# Patient Record
Sex: Female | Born: 1980 | Race: Black or African American | Hispanic: No | Marital: Married | Smoking: Current some day smoker
Health system: Southern US, Community
[De-identification: ages and names within clinical notes are randomized; demographics above are authoritative.]

## PROBLEM LIST (undated history)

## (undated) DIAGNOSIS — K219 Gastro-esophageal reflux disease without esophagitis: Secondary | ICD-10-CM

## (undated) DIAGNOSIS — D649 Anemia, unspecified: Secondary | ICD-10-CM

## (undated) DIAGNOSIS — F419 Anxiety disorder, unspecified: Secondary | ICD-10-CM

## (undated) DIAGNOSIS — F32A Depression, unspecified: Secondary | ICD-10-CM

## (undated) HISTORY — DX: Anxiety disorder, unspecified: F41.9

## (undated) HISTORY — DX: Depression, unspecified: F32.A

## (undated) HISTORY — DX: Gastro-esophageal reflux disease without esophagitis: K21.9

## (undated) HISTORY — PX: WISDOM TOOTH EXTRACTION: SHX21

## (undated) HISTORY — DX: Anemia, unspecified: D64.9

---
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
## 2003-07-23 ENCOUNTER — Inpatient Hospital Stay (HOSPITAL_COMMUNITY): Admission: AD | Admit: 2003-07-23 | Discharge: 2003-07-23 | Payer: Self-pay | Admitting: *Deleted

## 2003-09-08 ENCOUNTER — Emergency Department (HOSPITAL_COMMUNITY): Admission: EM | Admit: 2003-09-08 | Discharge: 2003-09-08 | Payer: Self-pay | Admitting: Emergency Medicine

## 2011-09-21 ENCOUNTER — Other Ambulatory Visit (HOSPITAL_COMMUNITY)
Admission: RE | Admit: 2011-09-21 | Discharge: 2011-09-21 | Disposition: A | Discharge status: Home / Self Care | Payer: Self-pay | Source: Ambulatory Visit | Attending: Obstetrics and Gynecology | Admitting: Obstetrics and Gynecology

## 2011-09-21 DIAGNOSIS — Z01419 Encounter for gynecological examination (general) (routine) without abnormal findings: Secondary | ICD-10-CM | POA: Insufficient documentation

## 2013-05-03 ENCOUNTER — Other Ambulatory Visit: Payer: Self-pay | Admitting: Obstetrics and Gynecology

## 2013-05-03 ENCOUNTER — Other Ambulatory Visit (HOSPITAL_COMMUNITY)
Admission: RE | Admit: 2013-05-03 | Discharge: 2013-05-03 | Disposition: A | Discharge status: Home / Self Care | Payer: BC Managed Care – PPO | Source: Ambulatory Visit | Attending: Obstetrics and Gynecology | Admitting: Obstetrics and Gynecology

## 2013-05-03 DIAGNOSIS — Z01419 Encounter for gynecological examination (general) (routine) without abnormal findings: Secondary | ICD-10-CM | POA: Insufficient documentation

## 2013-05-03 DIAGNOSIS — Z1151 Encounter for screening for human papillomavirus (HPV): Secondary | ICD-10-CM | POA: Insufficient documentation

## 2015-04-28 ENCOUNTER — Other Ambulatory Visit (HOSPITAL_COMMUNITY)
Admission: RE | Admit: 2015-04-28 | Discharge: 2015-04-28 | Disposition: A | Discharge status: Home / Self Care | Payer: BLUE CROSS/BLUE SHIELD | Source: Ambulatory Visit | Attending: Obstetrics and Gynecology | Admitting: Obstetrics and Gynecology

## 2015-04-28 ENCOUNTER — Other Ambulatory Visit: Payer: Self-pay | Admitting: Obstetrics and Gynecology

## 2015-04-28 DIAGNOSIS — Z01419 Encounter for gynecological examination (general) (routine) without abnormal findings: Secondary | ICD-10-CM | POA: Diagnosis not present

## 2015-05-02 LAB — CYTOLOGY - PAP

## 2016-09-16 ENCOUNTER — Institutional Professional Consult (permissible substitution): Payer: Self-pay | Admitting: Family Medicine

## 2018-02-05 ENCOUNTER — Other Ambulatory Visit: Payer: Self-pay

## 2018-02-05 ENCOUNTER — Ambulatory Visit (HOSPITAL_COMMUNITY)
Admission: EM | Admit: 2018-02-05 | Discharge: 2018-02-05 | Disposition: A | Discharge status: Home / Self Care | Payer: BLUE CROSS/BLUE SHIELD | Attending: Internal Medicine | Admitting: Internal Medicine

## 2018-02-05 ENCOUNTER — Encounter (HOSPITAL_COMMUNITY): Payer: Self-pay | Admitting: Emergency Medicine

## 2018-02-05 DIAGNOSIS — K047 Periapical abscess without sinus: Secondary | ICD-10-CM | POA: Insufficient documentation

## 2018-02-05 MED ORDER — CEFTRIAXONE SODIUM 250 MG IJ SOLR
250.0000 mg | Freq: Once | INTRAMUSCULAR | Status: AC
  Administered 2018-02-05: 250 mg via INTRAMUSCULAR

## 2018-02-05 MED ORDER — LIDOCAINE HCL (PF) 1 % IJ SOLN
INTRAMUSCULAR | Status: AC
  Filled 2018-02-05: qty 2

## 2018-02-05 MED ORDER — CEFTRIAXONE SODIUM 250 MG IJ SOLR
INTRAMUSCULAR | Status: AC
  Filled 2018-02-05: qty 250

## 2018-02-05 MED ORDER — AMOXICILLIN-POT CLAVULANATE 875-125 MG PO TABS: 1.0000 | ORAL_TABLET | Freq: Two times a day (BID) | ORAL | 0 refills | Status: DC

## 2018-02-05 NOTE — ED Triage Notes (Signed)
Oral abscess symptoms noticed a week ago. Initially painful, swollen.  Over the past 2 days symptoms worsened significantly

## 2018-02-05 NOTE — ED Provider Notes (Signed)
[facility]    CSN: [id number] Arrival date & time: 02/05/18  1622     History   Chief Complaint Chief Complaint  Patient presents with  . Dental Pain    HPI Kelli Russo is a 38 y.o. female.   38 year old female with pain in her left upper jaw x1 week.  She denies fevers, nausea, vomiting or diarrhea.     History reviewed. No pertinent past medical history.  There are no active problems to display for this patient.   Past Surgical History:  Procedure Laterality Date  . WISDOM TOOTH EXTRACTION      OB History   No obstetric history on file.      Home Medications    Prior to Admission medications   Medication Sig Start Date End Date Taking? Authorizing Provider  ibuprofen (ADVIL,MOTRIN) 200 MG tablet Take 200 mg by mouth every 6 (six) hours as needed.   Yes [provider]  amoxicillin-clavulanate (AUGMENTIN) 875-125 MG tablet Take 1 tablet by mouth every 12 (twelve) hours. 02/05/18   Arnaldo Natal, MD    Family History Family History  Problem Relation Age of Onset  . Diabetes Mother   . CAD Mother   . Cancer Father   . Diabetes Father     Social History Social History   Tobacco Use  . Smoking status: Current Some Day Smoker  Substance Use Topics  . Alcohol use: Yes  . Drug use: Never     Allergies   Patient has no known allergies.   Review of Systems Review of Systems  Constitutional: Negative for chills and fever.  HENT: Negative for sore throat and tinnitus.   Eyes: Negative for redness.  Respiratory: Negative for cough and shortness of breath.   Cardiovascular: Negative for chest pain and palpitations.  Gastrointestinal: Negative for abdominal pain, diarrhea, nausea and vomiting.  Genitourinary: Negative for dysuria, frequency and urgency.  Musculoskeletal: Negative for myalgias.  Skin: Negative for rash.       No lesions  Neurological: Negative for weakness.  Hematological: Does not bruise/bleed easily.    Psychiatric/Behavioral: Negative for suicidal ideas.     Physical Exam Triage Vital Signs ED Triage Vitals  Enc Vitals Group     BP 02/05/18 1752 118/69     Pulse Rate 02/05/18 1752 75     Resp 02/05/18 1752 16     Temp 02/05/18 1752 99.6 F (37.6 C)     Temp Source 02/05/18 1752 Temporal     SpO2 02/05/18 1752 100 %     Weight --      Height --      Head Circumference --      Peak Flow --      Pain Score 02/05/18 1748 3     Pain Loc --      Pain Edu? --      Excl. in GC? --    No data found.  Updated Vital Signs BP 118/69 (BP Location: Right Arm)   Pulse 75   Temp 99.6 F (37.6 C) (Temporal)   Resp 16   LMP 02/05/2018   SpO2 100%   Visual Acuity Right Eye Distance:   Left Eye Distance:   Bilateral Distance:    Right Eye Near:   Left Eye Near:    Bilateral Near:     Physical Exam Vitals signs and nursing note reviewed.  Constitutional:      General: She is not in acute distress.  Appearance: She is well-developed.  HENT:     Head: Normocephalic and atraumatic.     Mouth/Throat:   Eyes:     General: No scleral icterus.    Conjunctiva/sclera: Conjunctivae normal.     Pupils: Pupils are equal, round, and reactive to light.  Neck:     Musculoskeletal: Normal range of motion and neck supple.     Thyroid: No thyromegaly.     Vascular: No JVD.     Trachea: No tracheal deviation.  Cardiovascular:     Rate and Rhythm: Normal rate and regular rhythm.     Heart sounds: Normal heart sounds. No murmur. No friction rub. No gallop.   Pulmonary:     Effort: Pulmonary effort is normal.     Breath sounds: Normal breath sounds.  Abdominal:     General: Bowel sounds are normal. There is no distension.     Palpations: Abdomen is soft.     Tenderness: There is no abdominal tenderness.  Musculoskeletal: Normal range of motion.  Lymphadenopathy:     Cervical: No cervical adenopathy.  Skin:    General: Skin is warm and dry.  Neurological:     Mental Status:  She is alert and oriented to person, place, and time.     Cranial Nerves: No cranial nerve deficit.  Psychiatric:        Behavior: Behavior normal.        Thought Content: Thought content normal.        Judgment: Judgment normal.      UC Treatments / Results  Labs (all labs ordered are listed, but only abnormal results are displayed) Labs Reviewed - No data to display  EKG None  Radiology No results found.  Procedures Procedures (including critical care time)  Medications Ordered in UC Medications  cefTRIAXone (ROCEPHIN) injection 250 mg (has no administration in time range)    Initial Impression / Assessment and Plan / UC Course  I have reviewed the triage vital signs and the nursing notes.  Pertinent labs & imaging results that were available during my care of the patient were reviewed by me and considered in my medical decision making (see chart for details).     Augmentin for dental abscess.  No signs or symptoms of sepsis.  Advised patient to seek dental evaluation ASAP. Final Clinical Impressions(s) / UC Diagnoses   Final diagnoses:  Dental abscess   Discharge Instructions   None    ED Prescriptions    Medication Sig Dispense Auth. Provider   amoxicillin-clavulanate (AUGMENTIN) 875-125 MG tablet Take 1 tablet by mouth every 12 (twelve) hours. 14 tablet Arnaldo Natal, MD     Controlled Substance Prescriptions [place] Controlled Substance Registry consulted? Not Applicable   Arnaldo Natal, MD 02/05/18 Claria Dice

## 2019-04-24 ENCOUNTER — Other Ambulatory Visit: Payer: Self-pay

## 2019-04-24 ENCOUNTER — Ambulatory Visit (HOSPITAL_COMMUNITY)
Admission: EM | Admit: 2019-04-24 | Discharge: 2019-04-24 | Disposition: A | Discharge status: Home / Self Care | Payer: BC Managed Care – PPO | Attending: Urgent Care | Admitting: Urgent Care

## 2019-04-24 ENCOUNTER — Encounter (HOSPITAL_COMMUNITY): Payer: Self-pay

## 2019-04-24 DIAGNOSIS — R1084 Generalized abdominal pain: Secondary | ICD-10-CM

## 2019-04-24 DIAGNOSIS — N946 Dysmenorrhea, unspecified: Secondary | ICD-10-CM

## 2019-04-24 DIAGNOSIS — R102 Pelvic and perineal pain: Secondary | ICD-10-CM

## 2019-04-24 DIAGNOSIS — Z3202 Encounter for pregnancy test, result negative: Secondary | ICD-10-CM | POA: Diagnosis not present

## 2019-04-24 LAB — POCT URINALYSIS DIP (DEVICE)
Glucose, UA: NEGATIVE mg/dL
Leukocytes,Ua: NEGATIVE
Nitrite: NEGATIVE
Protein, ur: 30 mg/dL — AB
Specific Gravity, Urine: >=1.03 (ref 1.005–1.030)
Urobilinogen, UA: 0.2 mg/dL (ref 0.0–1.0)
pH: 5.5 (ref 5.0–8.0)

## 2019-04-24 LAB — POC URINE PREG, ED: Preg Test, Ur: NEGATIVE

## 2019-04-24 LAB — POCT PREGNANCY, URINE: Preg Test, Ur: NEGATIVE

## 2019-04-24 MED ORDER — TRANEXAMIC ACID 650 MG PO TABS: 1300.0000 mg | ORAL_TABLET | Freq: Three times a day (TID) | ORAL | 0 refills | Status: DC

## 2019-04-24 NOTE — ED Provider Notes (Signed)
[facility]   MRN: [id number] DOB: [birth year]  Subjective:   Kelli Russo is a 39 y.o. female presenting for recurrent and moderate to severe abdominal cramping since yesterday.  Patient started her cycle, states that she usually has heavy cycles and a lot of pelvic cramping.  She used to see a regular doctor through [facility] but is in the process of finding some money.  Used to take tranexamic acid and would like to have this again.  Denies concern for pregnancy, STI or UTI.  No current facility-administered medications for this encounter.  Current Outpatient Medications:  .  amoxicillin-clavulanate (AUGMENTIN) 875-125 MG tablet, Take 1 tablet by mouth every 12 (twelve) hours., Disp: 14 tablet, Rfl: 0 .  ibuprofen (ADVIL,MOTRIN) 200 MG tablet, Take 200 mg by mouth every 6 (six) hours as needed., Disp: , Rfl:    No Known Allergies  History reviewed. No pertinent past medical history.   Past Surgical History:  Procedure Laterality Date  . WISDOM TOOTH EXTRACTION      Family History  Problem Relation Age of Onset  . Diabetes Mother   . CAD Mother   . Cancer Father   . Diabetes Father     Social History   Tobacco Use  . Smoking status: Current Some Day Smoker  Substance Use Topics  . Alcohol use: Yes  . Drug use: Never    ROS   Objective:   Vitals: BP (!) 136/93 (BP Location: Right Arm)   Pulse 69   Temp 98.8 F (37.1 C) (Oral)   Resp 18   LMP 04/23/2019   SpO2 100%   Physical Exam Constitutional:      General: She is not in acute distress.    Appearance: Normal appearance. She is well-developed and normal weight. She is not ill-appearing, toxic-appearing or diaphoretic.  HENT:     Head: Normocephalic and atraumatic.     Right Ear: External ear normal.     Left Ear: External ear normal.     Nose: Nose normal.     Mouth/Throat:     Mouth: Mucous membranes are moist.     Pharynx: Oropharynx is clear.  Eyes:     General: No scleral  icterus.    Extraocular Movements: Extraocular movements intact.     Pupils: Pupils are equal, round, and reactive to light.  Cardiovascular:     Rate and Rhythm: Normal rate and regular rhythm.     Heart sounds: Normal heart sounds. No murmur. No friction rub. No gallop.   Pulmonary:     Effort: Pulmonary effort is normal. No respiratory distress.     Breath sounds: Normal breath sounds. No stridor. No wheezing, rhonchi or rales.  Abdominal:     General: Bowel sounds are normal. There is no distension.     Palpations: Abdomen is soft. There is no mass.     Tenderness: There is generalized abdominal tenderness and tenderness in the periumbilical area and suprapubic area. There is no right CVA tenderness, left CVA tenderness, guarding or rebound.  Skin:    General: Skin is warm and dry.     Coloration: Skin is not pale.     Findings: No rash.  Neurological:     General: No focal deficit present.     Mental Status: She is alert and oriented to person, place, and time.  Psychiatric:        Mood and Affect: Mood normal.        Behavior: Behavior normal.  Thought Content: Thought content normal.        Judgment: Judgment normal.     Results for orders placed or performed during the hospital encounter of 04/24/19 (from the past 24 hour(s))  POCT urinalysis dip (device)     Status: Abnormal   Collection Time: 04/24/19  8:03 PM  Result Value Ref Range   Glucose, UA NEGATIVE NEGATIVE mg/dL   Bilirubin Urine SMALL (A) NEGATIVE   Ketones, ur TRACE (A) NEGATIVE mg/dL   Specific Gravity, Urine >=1.030 1.005 - 1.030   Hgb urine dipstick LARGE (A) NEGATIVE   pH 5.5 5.0 - 8.0   Protein, ur 30 (A) NEGATIVE mg/dL   Urobilinogen, UA 0.2 0.0 - 1.0 mg/dL   Nitrite NEGATIVE NEGATIVE   Leukocytes,Ua NEGATIVE NEGATIVE  POC urine pregnancy     Status: None   Collection Time: 04/24/19  8:10 PM  Result Value Ref Range   Preg Test, Ur NEGATIVE NEGATIVE  Pregnancy, urine POC     Status: None    Collection Time: 04/24/19  8:10 PM  Result Value Ref Range   Preg Test, Ur NEGATIVE NEGATIVE    Assessment and Plan :   1. Generalized abdominal pain   2. Pelvic cramping   3. Dysmenorrhea     Restart [place], provided with information to gynecologic practices. Counseled patient on potential for adverse effects with medications prescribed/recommended today, ER and return-to-clinic precautions discussed, patient verbalized understanding.    Wallis Bamberg, PA-C 04/27/19 1135

## 2019-04-24 NOTE — ED Triage Notes (Signed)
Pt presents with generalized abdominal cramping since yesterday.

## 2019-12-28 ENCOUNTER — Encounter (HOSPITAL_COMMUNITY): Payer: Self-pay | Admitting: Emergency Medicine

## 2019-12-28 ENCOUNTER — Ambulatory Visit (HOSPITAL_COMMUNITY)
Admission: EM | Admit: 2019-12-28 | Discharge: 2019-12-28 | Disposition: A | Discharge status: Home / Self Care | Payer: BC Managed Care – PPO | Attending: Internal Medicine | Admitting: Internal Medicine

## 2019-12-28 ENCOUNTER — Other Ambulatory Visit: Payer: Self-pay

## 2019-12-28 DIAGNOSIS — K047 Periapical abscess without sinus: Secondary | ICD-10-CM

## 2019-12-28 MED ORDER — AMOXICILLIN-POT CLAVULANATE 875-125 MG PO TABS: 1.0000 | ORAL_TABLET | Freq: Two times a day (BID) | ORAL | 0 refills | Status: DC

## 2019-12-28 NOTE — ED Triage Notes (Signed)
Patient c/o LFT sided upper dental abscess x 1 week.   Patient endorses facial swelling on the LFT side that started 2 days ago.   Patient used Listerine with no relief of symptoms.   Patient denies fever.

## 2019-12-28 NOTE — ED Provider Notes (Signed)
[facility]    CSN: [id number] Arrival date & time: 12/28/19  1829      History   Chief Complaint Chief Complaint  Patient presents with  . Dental Problem    HPI Kelli Russo is a 39 y.o. female comes to the urgent care with dental abscess of 1 week duration.  Patient has been using Listerine with partial relief.  She currently has no pain.  Over the past couple of days she  has noticed some left facial swelling and hence the visit to the urgent care.  No fever or chills.  Patient has dental cavity of the left second maxillary premolar.Marland Kitchen   HPI  History reviewed. No pertinent past medical history.  There are no problems to display for this patient.   Past Surgical History:  Procedure Laterality Date  . WISDOM TOOTH EXTRACTION      OB History   No obstetric history on file.      Home Medications    Prior to Admission medications   Medication Sig Start Date End Date Taking? Authorizing Provider  ibuprofen (ADVIL,MOTRIN) 200 MG tablet Take 200 mg by mouth every 6 (six) hours as needed.   Yes [provider]  amoxicillin-clavulanate (AUGMENTIN) 875-125 MG tablet Take 1 tablet by mouth every 12 (twelve) hours. 12/28/19   LampteyBritta Mccreedy, MD    Family History Family History  Problem Relation Age of Onset  . Diabetes Mother   . CAD Mother   . Cancer Father   . Diabetes Father     Social History Social History   Tobacco Use  . Smoking status: Current Some Day Smoker  . Smokeless tobacco: Never Used  Vaping Use  . Vaping Use: Never used  Substance Use Topics  . Alcohol use: Yes  . Drug use: Never     Allergies   Patient has no known allergies.   Review of Systems Review of Systems  HENT: Positive for dental problem and facial swelling.   Eyes: Negative.   Neurological: Negative for dizziness, light-headedness and headaches.     Physical Exam Triage Vital Signs ED Triage Vitals  Enc Vitals Group     BP 12/28/19 1902  116/85     Pulse Rate 12/28/19 1902 (!) 118     Resp 12/28/19 1902 17     Temp 12/28/19 1902 98.5 F (36.9 C)     Temp Source 12/28/19 1902 Oral     SpO2 12/28/19 1902 97 %     Weight --      Height 12/28/19 1900 5\' 3"  (1.6 m)     Head Circumference --      Peak Flow --      Pain Score 12/28/19 1900 0     Pain Loc --      Pain Edu? --      Excl. in GC? --    No data found.  Updated Vital Signs BP 116/85 (BP Location: Right Arm)   Pulse (!) 118   Temp 98.5 F (36.9 C) (Oral)   Resp 17   Ht 5\' 3"  (1.6 m)   LMP 12/23/2019   SpO2 97%   Visual Acuity Right Eye Distance:   Left Eye Distance:   Bilateral Distance:    Right Eye Near:   Left Eye Near:    Bilateral Near:     Physical Exam Vitals and nursing note reviewed.  Constitutional:      General: She is not in acute distress.  Appearance: She is not ill-appearing.  HENT:     Right Ear: Tympanic membrane normal.     Left Ear: Tympanic membrane normal.     Nose:     Comments: Left premolar dental cavity with gum swelling.  Mild left facial swelling.    Mouth/Throat:     Mouth: Mucous membranes are moist.     Pharynx: No posterior oropharyngeal erythema.  Cardiovascular:     Rate and Rhythm: Normal rate and regular rhythm.  Musculoskeletal:     Cervical back: Normal range of motion.  Neurological:     Mental Status: She is alert.      UC Treatments / Results  Labs (all labs ordered are listed, but only abnormal results are displayed) Labs Reviewed - No data to display  EKG   Radiology No results found.  Procedures Procedures (including critical care time)  Medications Ordered in UC Medications - No data to display  Initial Impression / Assessment and Plan / UC Course  I have reviewed the triage vital signs and the nursing notes.  Pertinent labs & imaging results that were available during my care of the patient were reviewed by me and considered in my medical decision making (see chart for  details).     1.  Dental abscess: Augmentin 875-125 mg twice daily for 7 days Over-the-counter ibuprofen as needed for pain Make an appointment with a dentist to have dental work done. Return precautions given. Final Clinical Impressions(s) / UC Diagnoses   Final diagnoses:  Dental abscess     Discharge Instructions     Please call the dental resources given to you at discharge to have some dental work done. If you have pain please take NSAIDs.   ED Prescriptions    Medication Sig Dispense Auth. Provider   amoxicillin-clavulanate (AUGMENTIN) 875-125 MG tablet Take 1 tablet by mouth every 12 (twelve) hours. 14 tablet Jahvon Gosline, Britta Mccreedy, MD     PDMP not reviewed this encounter.   Merrilee Jansky, MD 12/28/19 2114

## 2019-12-28 NOTE — Discharge Instructions (Signed)
Please call the dental resources given to you at discharge to have some dental work done. If you have pain please take NSAIDs.

## 2020-05-18 ENCOUNTER — Other Ambulatory Visit: Payer: Self-pay

## 2020-05-18 ENCOUNTER — Ambulatory Visit (HOSPITAL_COMMUNITY)
Admission: EM | Admit: 2020-05-18 | Discharge: 2020-05-18 | Disposition: A | Discharge status: Home / Self Care | Payer: Self-pay | Attending: Emergency Medicine | Admitting: Emergency Medicine

## 2020-05-18 ENCOUNTER — Emergency Department (HOSPITAL_COMMUNITY)
Admission: EM | Admit: 2020-05-18 | Discharge: 2020-05-18 | Disposition: A | Discharge status: Home / Self Care | Payer: Self-pay | Attending: Emergency Medicine | Admitting: Emergency Medicine

## 2020-05-18 ENCOUNTER — Encounter (HOSPITAL_COMMUNITY): Payer: Self-pay

## 2020-05-18 ENCOUNTER — Encounter (HOSPITAL_COMMUNITY): Payer: Self-pay | Admitting: Emergency Medicine

## 2020-05-18 DIAGNOSIS — R109 Unspecified abdominal pain: Secondary | ICD-10-CM | POA: Insufficient documentation

## 2020-05-18 DIAGNOSIS — Z5321 Procedure and treatment not carried out due to patient leaving prior to being seen by health care provider: Secondary | ICD-10-CM | POA: Insufficient documentation

## 2020-05-18 DIAGNOSIS — R634 Abnormal weight loss: Secondary | ICD-10-CM | POA: Insufficient documentation

## 2020-05-18 DIAGNOSIS — R1010 Upper abdominal pain, unspecified: Secondary | ICD-10-CM

## 2020-05-18 DIAGNOSIS — R5383 Other fatigue: Secondary | ICD-10-CM | POA: Insufficient documentation

## 2020-05-18 MED ORDER — OMEPRAZOLE 20 MG PO CPDR: 20.0000 mg | DELAYED_RELEASE_CAPSULE | Freq: Every day | ORAL | 0 refills | Status: DC

## 2020-05-18 NOTE — ED Notes (Signed)
Per screener, patient turned in labels and reports she is leaving. 

## 2020-05-18 NOTE — ED Triage Notes (Signed)
Sent from UC d/t abdominal pain x4 months, weight loss and fatigue. She reports she was told that she may have pancreatitis.

## 2020-05-18 NOTE — Discharge Instructions (Addendum)
I would recommend going directly to the Emergency Department for further evaluation of your abdominal pain, vomiting, and weight loss.

## 2020-05-18 NOTE — ED Provider Notes (Signed)
[facility]    CSN: [id number] Arrival date & time: 05/18/20  1740      History   Chief Complaint Chief Complaint  Patient presents with  . Gastroesophageal Reflux    HPI Kelli Russo is a 40 y.o. female.   Patient here for evaluation of acid reflux, abdominal pain, and vomiting that has been ongoing for the past 4 months.  Reports taking Prevacid which was initially helpful but is no longer helping.  Reports having significant weight loss.  States pain worse after eating.  Other than taking Prevacid patient has not tried any OTC medications. Denies any trauma, injury, or other precipitating event. Denies any fevers, chest pain, shortness of breath, numbness, tingling, weakness, abdominal pain, or headaches.   ROS: As per HPI, all other pertinent ROS negative   The history is provided by the patient.  Gastroesophageal Reflux    History reviewed. No pertinent past medical history.  There are no problems to display for this patient.   Past Surgical History:  Procedure Laterality Date  . WISDOM TOOTH EXTRACTION      OB History   No obstetric history on file.      Home Medications    Prior to Admission medications   Medication Sig Start Date End Date Taking? Authorizing Provider  omeprazole (PRILOSEC) 20 MG capsule Take 1 capsule (20 mg total) by mouth daily. 05/18/20  Yes Ivette Loyal, NP  amoxicillin-clavulanate (AUGMENTIN) 875-125 MG tablet Take 1 tablet by mouth every 12 (twelve) hours. 12/28/19   LampteyBritta Mccreedy, MD  ibuprofen (ADVIL,MOTRIN) 200 MG tablet Take 200 mg by mouth every 6 (six) hours as needed.    [provider]    Family History Family History  Problem Relation Age of Onset  . Diabetes Mother   . CAD Mother   . Cancer Father   . Diabetes Father     Social History Social History   Tobacco Use  . Smoking status: Current Some Day Smoker  . Smokeless tobacco: Never Used  Vaping Use  . Vaping Use: Never used   Substance Use Topics  . Alcohol use: Yes  . Drug use: Never     Allergies   Patient has no known allergies.   Review of Systems Review of Systems  Constitutional: Positive for unexpected weight change.  Gastrointestinal: Positive for abdominal distention and vomiting.  All other systems reviewed and are negative.    Physical Exam Triage Vital Signs ED Triage Vitals  Enc Vitals Group     BP 05/18/20 1800 128/86     Pulse Rate 05/18/20 1800 84     Resp 05/18/20 1800 18     Temp 05/18/20 1800 98.5 F (36.9 C)     Temp Source 05/18/20 1800 Oral     SpO2 05/18/20 1800 100 %     Weight --      Height --      Head Circumference --      Peak Flow --      Pain Score 05/18/20 1759 5     Pain Loc --      Pain Edu? --      Excl. in GC? --    No data found.  Updated Vital Signs BP 128/86 (BP Location: Right Arm)   Pulse 84   Temp 98.5 F (36.9 C) (Oral)   Resp 18   SpO2 100%   Visual Acuity Right Eye Distance:   Left Eye Distance:   Bilateral Distance:  Right Eye Near:   Left Eye Near:    Bilateral Near:     Physical Exam Vitals and nursing note reviewed.  Constitutional:      General: She is not in acute distress.    Appearance: Normal appearance. She is not ill-appearing, toxic-appearing or diaphoretic.  HENT:     Head: Normocephalic and atraumatic.  Eyes:     Conjunctiva/sclera: Conjunctivae normal.  Cardiovascular:     Rate and Rhythm: Normal rate.     Pulses: Normal pulses.  Pulmonary:     Effort: Pulmonary effort is normal.  Abdominal:     General: Abdomen is flat.  Musculoskeletal:        General: Normal range of motion.     Cervical back: Normal range of motion.  Skin:    General: Skin is warm and dry.  Neurological:     General: No focal deficit present.     Mental Status: She is alert and oriented to person, place, and time.  Psychiatric:        Mood and Affect: Mood normal.      UC Treatments / Results  Labs (all labs ordered  are listed, but only abnormal results are displayed) Labs Reviewed - No data to display  EKG   Radiology No results found.  Procedures Procedures (including critical care time)  Medications Ordered in UC Medications - No data to display  Initial Impression / Assessment and Plan / UC Course  I have reviewed the triage vital signs and the nursing notes.  Pertinent labs & imaging results that were available during my care of the patient were reviewed by me and considered in my medical decision making (see chart for details).     Based on symptoms and weight loss, patient was sent to the emergency room for further evaluation at a higher level of care.  Based on pain and tenderness in the left upper quadrant and vomiting, patient needs further evaluation to rule out pancreatitis.  Patient requesting a refill of another medication for acid reflux.  Omeprazole was sent but encourage patient to still go to the emergency room for further evaluation.  Patient verbalized understanding and will go at this time.  Final Clinical Impressions(s) / UC Diagnoses   Final diagnoses:  Pain of upper abdomen  Loss of weight     Discharge Instructions     I would recommend going directly to the Emergency Department for further evaluation of your abdominal pain, vomiting, and weight loss.     ED Prescriptions    Medication Sig Dispense Auth. Provider   omeprazole (PRILOSEC) 20 MG capsule Take 1 capsule (20 mg total) by mouth daily. 30 capsule Ivette Loyal, NP     PDMP not reviewed this encounter.   Ivette Loyal, NP 05/18/20 [phone]

## 2020-05-18 NOTE — ED Triage Notes (Signed)
Pt present acid reflux, symptom started four months ago. Pt states she Is having stomach pain and weight loss with fatigue. Pt state scared to eat because of the severity of the abdomen pain.

## 2020-09-09 ENCOUNTER — Encounter: Payer: Self-pay | Admitting: Gastroenterology

## 2020-09-24 ENCOUNTER — Ambulatory Visit: Payer: Self-pay | Admitting: Gastroenterology

## 2020-10-10 ENCOUNTER — Other Ambulatory Visit: Payer: No Typology Code available for payment source

## 2020-10-10 ENCOUNTER — Encounter: Payer: Self-pay | Admitting: Gastroenterology

## 2020-10-10 ENCOUNTER — Ambulatory Visit (INDEPENDENT_AMBULATORY_CARE_PROVIDER_SITE_OTHER): Payer: No Typology Code available for payment source | Admitting: Gastroenterology

## 2020-10-10 VITALS — BP 120/80 | HR 67 | Ht 64.0 in | Wt 153.5 lb

## 2020-10-10 DIAGNOSIS — K219 Gastro-esophageal reflux disease without esophagitis: Secondary | ICD-10-CM | POA: Diagnosis not present

## 2020-10-10 DIAGNOSIS — R1084 Generalized abdominal pain: Secondary | ICD-10-CM

## 2020-10-10 DIAGNOSIS — Z800 Family history of malignant neoplasm of digestive organs: Secondary | ICD-10-CM | POA: Diagnosis not present

## 2020-10-10 MED ORDER — DICYCLOMINE HCL 20 MG PO TABS: 20.0000 mg | ORAL_TABLET | ORAL | 1 refills | Status: DC

## 2020-10-10 NOTE — Progress Notes (Signed)
HPI : Kelli Russo is a very pleasant 40 year old female with a history of anxiety and depression referred to [place] for persistent GERD symptoms.  Patient states that she started having GERD symptoms following an episode of acute gastroenteritis last November. Her symptoms consist of heartburn, bloating and dyspepsia.  He initially improved after starting Protonix, but then worsened.  They improved again in May after adding famotidine, but she continues to have symptoms on a near daily basis.  She also takes Alka-Seltzer as needed.  She has a left-sided upper abdominal discomfort which sometimes radiate to the middle.  Sometimes has nausea.  No vomiting.  An H. pylori serology test was negative.  She takes NSAIDs only with her menstrual cycle.  She tried taking probiotics in May without improvement. She reports chronic irregular bowel habits and mostly poorly formed stools. Her father was diagnosed with colon cancer in his late 36s.  She has never had a colonoscopy.   Past Medical History:  Diagnosis Date   Anxiety    Depression    GERD (gastroesophageal reflux disease)      Past Surgical History:  Procedure Laterality Date   WISDOM TOOTH EXTRACTION     Family History  Problem Relation Age of Onset   Diabetes Mother    CAD Mother    Cancer Father    Diabetes Father    Colon cancer Father    Social History   Tobacco Use   Smoking status: Former    Types: Cigarettes   Smokeless tobacco: Never  Vaping Use   Vaping Use: Never used  Substance Use Topics   Alcohol use: Not Currently   Drug use: Never   Current Outpatient Medications  Medication Sig Dispense Refill   Acetaminophen (TYLENOL) 325 MG CAPS Take by mouth.     calcium carbonate (TUMS) 500 MG chewable tablet Chew 1 tablet by mouth daily.     Calcium Carbonate Antacid (ALKA-SELTZER ANTACID PO) Take by mouth.     famotidine (PEPCID) 20 MG tablet Take 20 mg by mouth 2 (two) times daily.     ibuprofen (ADVIL,MOTRIN) 200 MG  tablet Take 200 mg by mouth every 6 (six) hours as needed.     omeprazole (PRILOSEC) 20 MG capsule Take 1 capsule (20 mg total) by mouth daily. 30 capsule 0   pantoprazole (PROTONIX) 20 MG tablet Take 20 mg by mouth 2 (two) times daily.     No current facility-administered medications for this visit.   No Known Allergies   Review of Systems: All systems reviewed and negative except where noted in HPI.    No results found.  Physical Exam: BP 120/80   Pulse 67   Ht 5\' 4"  (1.626 m)   Wt 153 lb 8 oz (69.6 kg)   SpO2 99%   BMI 26.35 kg/m  Constitutional: Pleasant,well-developed, African-American female in no acute distress. HEENT: Normocephalic and atraumatic. Conjunctivae are normal. No scleral icterus. Cardiovascular: Normal rate, regular rhythm.  Pulmonary/chest: Effort normal and breath sounds normal. No wheezing, rales or rhonchi. Abdominal: Soft, nondistended, nontender. Bowel sounds active throughout. There are no masses palpable. No hepatomegaly. Extremities: no edema Neurological: Alert and oriented to person place and time. Skin: Skin is warm and dry. No rashes noted. Psychiatric: Normal mood and affect. Behavior is normal.  CBC No results found for: WBC, RBC, HGB, HCT, PLT, MCV, MCH, MCHC, RDW, LYMPHSABS, MONOABS, EOSABS, BASOSABS  CMP  No results found for: NA, K, CL, CO2, GLUCOSE, BUN, CREATININE, CALCIUM, PROT,  ALBUMIN, AST, ALT, ALKPHOS, BILITOT, GFRNONAA, GFRAA   ASSESSMENT AND PLAN: 40 year old female with persistent GERD and abdominal pain despite daily PPI and H2RA, with negative H. pylori serology, occasional NSAID use.  Also with persistent poorly formed stools.  Symptoms started following an acute gastroenteritis.  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  We will check TTG and IgA to screen for celiac disease.  We will plan for upper endoscopy to evaluate for peptic ulcer disease and reassess for H. pylori.  For the patient's irregular bowel habits and poorly  formed stool, I recommend she start taking Metamucil on a daily basis and try Bentyl to help with her abdominal discomfort. Because of the patient's family history of colon cancer (father, late 91s), she is due for initial screening colonoscopy.  GERD/dyspepsia/abdominal pain - Continue current regimen for now (Protonix, famotidine) - EGD - TTG/IgA - Trial of Bentyl  Family history of colon cancer - Colonoscopy  Loose stools -Metamucil  The details, risks (including bleeding, perforation, infection, missed lesions, medication reactions and possible hospitalization or surgery if complications occur), benefits, and alternatives to EGD/colonoscopy with possible biopsy and possible polypectomy were discussed with the patient and she consents to proceed.    Amandeep Hogston E. Tomasa Rand, MD [facility]   No ref. provider found

## 2020-10-10 NOTE — Patient Instructions (Signed)
If you are age 38 or older, your body mass index should be between 23-30. Your Body mass index is 26.35 kg/m. If this is out of the aforementioned range listed, please consider follow up with your Primary Care Provider.  If you are age 89 or younger, your body mass index should be between 19-25. Your Body mass index is 26.35 kg/m. If this is out of the aformentioned range listed, please consider follow up with your Primary Care Provider.   It has been recommended to you by your physician that you have a(n) Endoscopy and Colonoscopy completed. Per your request, we did not schedule the procedure(s) today. Please contact our office at [phone] should you decide to have the procedure completed. You will be scheduled for a pre-visit and procedure at that time.  Your provider has requested that you go to the basement level for lab work before leaving today. Press "B" on the elevator. The lab is located at the first door on the left as you exit the elevator.  We have sent the following medications to your pharmacy for you to pick up at your convenience: Bentyl 20 mg   Please purchase Metamucil over the counter. Take as directed.   The [facility] providers would like to encourage you to use [facility] to communicate with providers for non-urgent requests or questions.  Due to long hold times on the telephone, sending your provider a message by [facility] may be a faster and more efficient way to get a response.  Please allow 48 business hours for a response.  Please remember that this is for non-urgent requests.   Due to recent changes in healthcare laws, you may see the results of your imaging and laboratory studies on MyChart before your provider has had a chance to review them.  We understand that in some cases there may be results that are confusing or concerning to you. Not all laboratory results come back in the same time frame and the provider may be waiting for multiple results in order to interpret  others.  Please give [place] 48 hours in order for your provider to thoroughly review all the results before contacting the office for clarification of your results.    It was a pleasure to see you today!  Thank you for trusting me with your gastrointestinal care!    Scott E. Tomasa Rand, MD

## 2020-10-13 LAB — IGA: Immunoglobulin A: 198 mg/dL (ref 47–310)

## 2020-10-13 LAB — TISSUE TRANSGLUTAMINASE, IGA: (tTG) Ab, IgA: <1 U/mL

## 2020-10-14 ENCOUNTER — Other Ambulatory Visit: Payer: Self-pay

## 2020-10-14 ENCOUNTER — Telehealth: Payer: Self-pay | Admitting: Gastroenterology

## 2020-10-14 DIAGNOSIS — R1084 Generalized abdominal pain: Secondary | ICD-10-CM

## 2020-10-14 DIAGNOSIS — Z800 Family history of malignant neoplasm of digestive organs: Secondary | ICD-10-CM

## 2020-10-14 MED ORDER — NA SULFATE-K SULFATE-MG SULF 17.5-3.13-1.6 GM/177ML PO SOLN: 1.0000 | Freq: Once | ORAL | 0 refills | Status: AC

## 2020-10-14 NOTE — Telephone Encounter (Signed)
Patient called to reschedule her colonoscopy procedure requested you send her prep medication to the pharmacy.

## 2020-10-14 NOTE — Telephone Encounter (Signed)
Prep has been sent to patients pharmacy, Ambulatory referral has been placed and instructions will be sent to patient.

## 2020-10-18 NOTE — Progress Notes (Signed)
Maya,  Can you please call [name] and let her know that her celiac tests were negative? Thanks

## 2020-10-20 ENCOUNTER — Telehealth: Payer: Self-pay | Admitting: Gastroenterology

## 2020-10-20 MED ORDER — PANTOPRAZOLE SODIUM 20 MG PO TBEC: 20.0000 mg | DELAYED_RELEASE_TABLET | Freq: Two times a day (BID) | ORAL | 1 refills | Status: DC

## 2020-10-20 NOTE — Telephone Encounter (Signed)
All of the patients's questions were answered She asked for a refill of pantoprazole that was prescribed by the urgent care.  Rx sent

## 2020-10-20 NOTE — Telephone Encounter (Signed)
Patient called requested to speak with a nurse regarding her colonoscopy refused to say anything else.

## 2020-10-20 NOTE — Progress Notes (Signed)
Called patient and left VM to call me back.

## 2020-10-29 ENCOUNTER — Other Ambulatory Visit: Payer: Self-pay | Admitting: Gastroenterology

## 2020-11-06 ENCOUNTER — Encounter: Payer: No Typology Code available for payment source | Admitting: Gastroenterology

## 2020-11-17 ENCOUNTER — Other Ambulatory Visit: Payer: Self-pay | Admitting: Gastroenterology

## 2020-11-24 ENCOUNTER — Ambulatory Visit (AMBULATORY_SURGERY_CENTER): Payer: No Typology Code available for payment source | Admitting: Gastroenterology

## 2020-11-24 ENCOUNTER — Encounter: Payer: Self-pay | Admitting: Gastroenterology

## 2020-11-24 VITALS — BP 144/97 | HR 86 | Temp 98.9°F | Resp 13 | Ht 64.0 in | Wt 153.0 lb

## 2020-11-24 DIAGNOSIS — K3189 Other diseases of stomach and duodenum: Secondary | ICD-10-CM

## 2020-11-24 DIAGNOSIS — Z800 Family history of malignant neoplasm of digestive organs: Secondary | ICD-10-CM | POA: Diagnosis present

## 2020-11-24 DIAGNOSIS — R1013 Epigastric pain: Secondary | ICD-10-CM | POA: Diagnosis not present

## 2020-11-24 DIAGNOSIS — K2970 Gastritis, unspecified, without bleeding: Secondary | ICD-10-CM

## 2020-11-24 DIAGNOSIS — Z1211 Encounter for screening for malignant neoplasm of colon: Secondary | ICD-10-CM

## 2020-11-24 DIAGNOSIS — R1084 Generalized abdominal pain: Secondary | ICD-10-CM

## 2020-11-24 DIAGNOSIS — K2980 Duodenitis without bleeding: Secondary | ICD-10-CM | POA: Diagnosis not present

## 2020-11-24 MED ORDER — SODIUM CHLORIDE 0.9 % IV SOLN: 500.0000 mL | Freq: Once | INTRAVENOUS | Status: DC

## 2020-11-24 NOTE — Progress Notes (Signed)
Clarification obtained pt. Here for EGD AND COLONOSCOPY,INSURANCE VERIFIED PRIOR TO PROCEDURE.    CHECK-IN-AM  V/S-[place]

## 2020-11-24 NOTE — Op Note (Signed)
[facility] Patient Name: Kelli Russo Procedure Date: 11/24/2020 3:13 PM MRN: [id number] Endoscopist: Lorin Picket E. Tomasa Rand , MD Age: 40 Referring MD:  Date of Birth: February 04, 1980 Gender: Female Account #: [id number] Procedure:                Upper GI endoscopy Indications:              Epigastric abdominal pain Medicines:                Monitored Anesthesia Care Procedure:                Pre-Anesthesia Assessment:                           - Prior to the procedure, a History and Physical                            was performed, and patient medications and                            allergies were reviewed. The patient's tolerance of                            previous anesthesia was also reviewed. The risks                            and benefits of the procedure and the sedation                            options and risks were discussed with the patient.                            All questions were answered, and informed consent                            was obtained. Prior Anticoagulants: The patient has                            taken no previous anticoagulant or antiplatelet                            agents. ASA Grade Assessment: II - A patient with                            mild systemic disease. After reviewing the risks                            and benefits, the patient was deemed in                            satisfactory condition to undergo the procedure.                           After obtaining informed consent, the endoscope was  passed under direct vision. Throughout the                            procedure, the patient's blood pressure, pulse, and                            oxygen saturations were monitored continuously. The                            Endoscope was introduced through the mouth, and                            advanced to the third part of duodenum. The upper                            GI endoscopy was  accomplished without difficulty.                            The patient tolerated the procedure well. Scope In: Scope Out: Findings:                 The examined portions of the nasopharynx,                            oropharynx and larynx were normal.                           The examined esophagus was normal.                           The entire examined stomach was normal. Biopsies                            were taken with a cold forceps for Helicobacter                            pylori testing. Estimated blood loss was minimal.                           Multiple small sessile polypoid lesions with no                            bleeding were found in the duodenal bulb. Biopsies                            were taken with a cold forceps for histology.                            Estimated blood loss was minimal.                           The exam of the duodenum was otherwise normal. Complications:            No immediate complications. Estimated Blood Loss:     Estimated blood loss was minimal. Impression:               -  The examined portions of the nasopharynx,                            oropharynx and larynx were normal.                           - Normal esophagus.                           - Normal stomach. Biopsied.                           - Multiple duodenal polyps. Biopsied. Suspect                            peptic duodenitis. Recommendation:           - Patient has a contact number available for                            emergencies. The signs and symptoms of potential                            delayed complications were discussed with the                            patient. Return to normal activities tomorrow.                            Written discharge instructions were provided to the                            patient.                           - Resume previous diet.                           - Continue present medications.                           - Await  pathology results.                           - Follow up as needed in the GI clinic with Dr.                            Tomasa Rand. Lorin Picket E. Tomasa Rand, MD 11/24/2020 3:50:06 PM This report has been signed electronically.

## 2020-11-24 NOTE — Progress Notes (Signed)
Report given to PACU, vss 

## 2020-11-24 NOTE — Patient Instructions (Signed)
Handouts on polyps, hemorrhoids, and diverticulosis given to patient.  Await pathology results. Repeat colonoscopy in 5 years for surveillance due to family history.   YOU HAD AN ENDOSCOPIC PROCEDURE TODAY AT THE  ENDOSCOPY CENTER:   Refer to the procedure report that was given to you for any specific questions about what was found during the examination.  If the procedure report does not answer your questions, please call your gastroenterologist to clarify.  If you requested that your care partner not be given the details of your procedure findings, then the procedure report has been included in a sealed envelope for you to review at your convenience later.  YOU SHOULD EXPECT: Some feelings of bloating in the abdomen. Passage of more gas than usual.  Walking can help get rid of the air that was put into your GI tract during the procedure and reduce the bloating. If you had a lower endoscopy (such as a colonoscopy or flexible sigmoidoscopy) you may notice spotting of blood in your stool or on the toilet paper. If you underwent a bowel prep for your procedure, you may not have a normal bowel movement for a few days.  Please Note:  You might notice some irritation and congestion in your nose or some drainage.  This is from the oxygen used during your procedure.  There is no need for concern and it should clear up in a day or so.  SYMPTOMS TO REPORT IMMEDIATELY:  Following lower endoscopy (colonoscopy or flexible sigmoidoscopy):  Excessive amounts of blood in the stool  Significant tenderness or worsening of abdominal pains  Swelling of the abdomen that is new, acute  Fever of 100F or higher  Following upper endoscopy (EGD)  Vomiting of blood or coffee ground material  New chest pain or pain under the shoulder blades  Painful or persistently difficult swallowing  New shortness of breath  Fever of 100F or higher  Black, tarry-looking stools  For urgent or emergent issues, a  gastroenterologist can be reached at any hour by calling [phone]. Do not use MyChart messaging for urgent concerns.    DIET:  We do recommend a small meal at first, but then you may proceed to your regular diet.  Drink plenty of fluids but you should avoid alcoholic beverages for 24 hours.  ACTIVITY:  You should plan to take it easy for the rest of today and you should NOT DRIVE or use heavy machinery until tomorrow (because of the sedation medicines used during the test).    FOLLOW UP: Our staff will call the number listed on your records 48-72 hours following your procedure to check on you and address any questions or concerns that you may have regarding the information given to you following your procedure. If we do not reach you, we will leave a message.  We will attempt to reach you two times.  During this call, we will ask if you have developed any symptoms of COVID 19. If you develop any symptoms (ie: fever, flu-like symptoms, shortness of breath, cough etc.) before then, please call [phone].  If you test positive for Covid 19 in the 2 weeks post procedure, please call and report this information to [place].    If any biopsies were taken you will be contacted by phone or by letter within the next 1-3 weeks.  Please call us at [phone] if you have not heard about the biopsies in 3 weeks.    SIGNATURES/CONFIDENTIALITY: You and/or your care partner have  signed paperwork which will be entered into your electronic medical record.  These signatures attest to the fact that that the information above on your After Visit Summary has been reviewed and is understood.  Full responsibility of the confidentiality of this discharge information lies with you and/or your care-partner.

## 2020-11-24 NOTE — Progress Notes (Signed)
1505 Robinul 0.1 mg IV given due large amount of secretions upon assessment.  MD made aware, vss 

## 2020-11-24 NOTE — Progress Notes (Signed)
[facility] History and Physical   Primary Care Physician:  Patient, No Pcp Per (Inactive)   Reason for Procedure:   Colon cancer screening, high risk; Abdominal pain, bloating, heartburn  Plan:    Screening colonoscopy, diagnostic EGD     HPI: Kelli Russo is a 40 y.o. female who I saw in clinic Sept 23rd for symptoms of abdominal pain, bloating and dyspepsia that were persistent despite acid suppression.  H. Pylori serology test was negative.  Her father was diagnosed with colon cancer in his 14s.  She was recommended to undergo an upper and lower endoscopy.  Since her clinic visit, her upper GI symptoms are unchanged. She is taking Protonix and famotidine, as well as Bentyl.  Bentyl has helped her pain some.   Past Medical History:  Diagnosis Date   Anxiety    Depression    GERD (gastroesophageal reflux disease)     Past Surgical History:  Procedure Laterality Date   WISDOM TOOTH EXTRACTION      Prior to Admission medications   Medication Sig Start Date End Date Taking? Authorizing Provider  Acetaminophen (TYLENOL) 325 MG CAPS Take by mouth.    [provider]  calcium carbonate (TUMS) 500 MG chewable tablet Chew 1 tablet by mouth daily.    [provider]  Calcium Carbonate Antacid (ALKA-SELTZER ANTACID PO) Take by mouth.    [provider]  dicyclomine (BENTYL) 20 MG tablet TAKE 1 TABLET(20 MG) BY MOUTH EVERY 4 HOURS 11/17/20   Jenel Lucks, MD  famotidine (PEPCID) 20 MG tablet Take 20 mg by mouth 2 (two) times daily.    [provider]  ibuprofen (ADVIL,MOTRIN) 200 MG tablet Take 200 mg by mouth every 6 (six) hours as needed.    [provider]  omeprazole (PRILOSEC) 20 MG capsule Take 1 capsule (20 mg total) by mouth daily. 05/18/20   Ivette Loyal, NP  pantoprazole (PROTONIX) 20 MG tablet Take 1 tablet (20 mg total) by mouth 2 (two) times daily. 10/20/20   Jenel Lucks, MD    Current Outpatient  Medications  Medication Sig Dispense Refill   Acetaminophen (TYLENOL) 325 MG CAPS Take by mouth.     calcium carbonate (TUMS) 500 MG chewable tablet Chew 1 tablet by mouth daily.     Calcium Carbonate Antacid (ALKA-SELTZER ANTACID PO) Take by mouth.     dicyclomine (BENTYL) 20 MG tablet TAKE 1 TABLET(20 MG) BY MOUTH EVERY 4 HOURS 30 tablet 1   famotidine (PEPCID) 20 MG tablet Take 20 mg by mouth 2 (two) times daily.     ibuprofen (ADVIL,MOTRIN) 200 MG tablet Take 200 mg by mouth every 6 (six) hours as needed.     omeprazole (PRILOSEC) 20 MG capsule Take 1 capsule (20 mg total) by mouth daily. 30 capsule 0   pantoprazole (PROTONIX) 20 MG tablet Take 1 tablet (20 mg total) by mouth 2 (two) times daily. 60 tablet 1   No current facility-administered medications for this visit.    Allergies as of 11/24/2020   (No Known Allergies)    Family History  Problem Relation Age of Onset   Diabetes Mother    CAD Mother    Cancer Father    Diabetes Father    Colon cancer Father     Social History   Socioeconomic History   Marital status: Married    Spouse name: Not on file   Number of children: Not on file   Years of education: Not on  file   Highest education level: Not on file  Occupational History   Not on file  Tobacco Use   Smoking status: Some Days    Types: Cigarettes   Smokeless tobacco: Never  Vaping Use   Vaping Use: Never used  Substance and Sexual Activity   Alcohol use: Not Currently   Drug use: Never   Sexual activity: Not on file  Other Topics Concern   Not on file  Social History Narrative   Not on file   Social Determinants of Health   Financial Resource Strain: Not on file  Food Insecurity: Not on file  Transportation Needs: Not on file  Physical Activity: Not on file  Stress: Not on file  Social Connections: Not on file  Intimate Partner Violence: Not on file    Review of Systems:  All other review of systems negative except as mentioned in the  HPI.  Physical Exam: Vital signs BP 120/76 (Patient Position: Sitting)   Pulse (!) 106   Temp 98.9 F (37.2 C)   Ht 5\' 4"  (1.626 m)   Wt 153 lb (69.4 kg)   SpO2 100%   BMI 26.26 kg/m   General:   Alert,  Well-developed, well-nourished, pleasant and cooperative in NAD Airway:  Mallampati 1 Lungs:  Clear throughout to auscultation.   Heart:  Regular rate and rhythm; no murmurs, clicks, rubs,  or gallops. Abdomen:  Soft, nontender and nondistended. Normal bowel sounds.   Neuro/Psych:  Normal mood and affect. A and O x 3   Kelli Russo E. , MD [facility]

## 2020-11-24 NOTE — Op Note (Signed)
[facility] Patient Name: Kelli Russo Procedure Date: 11/24/2020 3:13 PM MRN: [id number] Endoscopist: Lorin Picket E. Tomasa Rand , MD Age: 40 Referring MD:  Date of Birth: [birth year] Gender: Female Account #: [id number] Procedure:                Colonoscopy Indications:              Screening in patient at increased risk: Colorectal                            cancer in father before age 14 Medicines:                Monitored Anesthesia Care Procedure:                Pre-Anesthesia Assessment:                           - Prior to the procedure, a History and Physical                            was performed, and patient medications and                            allergies were reviewed. The patient's tolerance of                            previous anesthesia was also reviewed. The risks                            and benefits of the procedure and the sedation                            options and risks were discussed with the patient.                            All questions were answered, and informed consent                            was obtained. Prior Anticoagulants: The patient has                            taken no previous anticoagulant or antiplatelet                            agents. ASA Grade Assessment: II - A patient with                            mild systemic disease. After reviewing the risks                            and benefits, the patient was deemed in                            satisfactory condition to undergo the procedure.  After obtaining informed consent, the colonoscope                            was passed under direct vision. Throughout the                            procedure, the patient's blood pressure, pulse, and                            oxygen saturations were monitored continuously. The                            Olympus PCF-H190DL [id number]) Colonoscope was                            introduced through the anus  and advanced to the the                            terminal ileum, with identification of the                            appendiceal orifice and IC valve. The colonoscopy                            was performed without difficulty. The patient                            tolerated the procedure well. The quality of the                            bowel preparation was good. The terminal ileum,                            ileocecal valve, appendiceal orifice, and rectum                            were photographed. Scope In: 3:30:12 PM Scope Out: 3:41:29 PM Scope Withdrawal Time: 0 hours 8 minutes 35 seconds  Total Procedure Duration: 0 hours 11 minutes 17 seconds  Findings:                 Skin tags were found on perianal exam.                           The digital rectal exam was normal.                           The entire examined colon appeared normal.                           The digital rectal exam was normal. Pertinent                            negatives include normal sphincter tone and no  palpable rectal lesions.                           A few small-mouthed diverticula were found in the                            sigmoid colon.                           The exam was otherwise normal throughout the                            examined colon.                           The terminal ileum appeared normal.                           Non-bleeding internal hemorrhoids were found during                            retroflexion. The hemorrhoids were Grade I                            (internal hemorrhoids that do not prolapse).                           No additional abnormalities were found on                            retroflexion. Complications:            No immediate complications. Estimated blood loss:                            None. Estimated Blood Loss:     Estimated blood loss: none. Impression:               - Perianal skin tags found on perianal  exam.                           - The entire examined colon is normal.                           - Diverticulosis in the sigmoid colon.                           - The examined portion of the ileum was normal.                           - Non-bleeding internal hemorrhoids.                           - No specimens collected. Recommendation:           - Patient has a contact number available for  emergencies. The signs and symptoms of potential                            delayed complications were discussed with the                            patient. Return to normal activities tomorrow.                            Written discharge instructions were provided to the                            patient.                           - Resume previous diet.                           - Continue present medications.                           - Repeat colonoscopy in 5 years for screening                            purposes due to family history of colon cancer. Kalep Full E. Tomasa Rand, MD 11/24/2020 3:54:20 PM This report has been signed electronically.

## 2020-11-26 ENCOUNTER — Telehealth: Payer: Self-pay

## 2020-11-26 NOTE — Telephone Encounter (Signed)
Left message

## 2020-12-01 ENCOUNTER — Encounter: Payer: Self-pay | Admitting: Gastroenterology

## 2020-12-17 ENCOUNTER — Other Ambulatory Visit (HOSPITAL_COMMUNITY): Payer: Self-pay | Admitting: Internal Medicine

## 2020-12-17 ENCOUNTER — Other Ambulatory Visit: Payer: Self-pay | Admitting: Internal Medicine

## 2020-12-17 ENCOUNTER — Other Ambulatory Visit: Payer: Self-pay | Admitting: Gastroenterology

## 2020-12-17 ENCOUNTER — Other Ambulatory Visit: Payer: Self-pay | Admitting: Surgery

## 2020-12-17 DIAGNOSIS — R1013 Epigastric pain: Secondary | ICD-10-CM

## 2020-12-18 ENCOUNTER — Ambulatory Visit (HOSPITAL_COMMUNITY)
Admission: RE | Admit: 2020-12-18 | Discharge: 2020-12-18 | Disposition: A | Discharge status: Home / Self Care | Payer: No Typology Code available for payment source | Source: Ambulatory Visit | Attending: Internal Medicine | Admitting: Internal Medicine

## 2020-12-18 ENCOUNTER — Other Ambulatory Visit: Payer: Self-pay

## 2020-12-18 DIAGNOSIS — R1013 Epigastric pain: Secondary | ICD-10-CM | POA: Diagnosis not present

## 2021-01-20 ENCOUNTER — Ambulatory Visit: Payer: Self-pay | Admitting: Surgery

## 2021-01-20 DIAGNOSIS — K8020 Calculus of gallbladder without cholecystitis without obstruction: Secondary | ICD-10-CM

## 2021-01-20 NOTE — Anesthesia Preprocedure Evaluation (Addendum)
Anesthesia Evaluation  Patient identified by MRN, date of birth, ID band Patient awake    Reviewed: Allergy & Precautions, NPO status , Patient's Chart, lab work & pertinent test results  History of Anesthesia Complications Negative for: history of anesthetic complications  Airway Mallampati: II  TM Distance: >3 FB Neck ROM: Full    Dental  (+) Missing,    Pulmonary Current Smoker,    Pulmonary exam normal        Cardiovascular negative cardio ROS Normal cardiovascular exam     Neuro/Psych Anxiety Depression negative neurological ROS     GI/Hepatic Neg liver ROS, GERD  Medicated and Controlled,cholecystitis   Endo/Other  negative endocrine ROS  Renal/GU negative Renal ROS  negative genitourinary   Musculoskeletal negative musculoskeletal ROS (+)   Abdominal   Peds  Hematology negative hematology ROS (+)   Anesthesia Other Findings Day of surgery medications reviewed with patient.  Reproductive/Obstetrics negative OB ROS                            Anesthesia Physical Anesthesia Plan  ASA: 2  Anesthesia Plan: General   Post-op Pain Management: Tylenol PO (pre-op) and Toradol IV (intra-op)   Induction: Intravenous  PONV Risk Score and Plan: 4 or greater and Treatment may vary due to age or medical condition, Ondansetron, Dexamethasone, Midazolam and Scopolamine patch - Pre-op  Airway Management Planned: Oral ETT  Additional Equipment: None  Intra-op Plan:   Post-operative Plan: Extubation in OR  Informed Consent: I have reviewed the patients History and Physical, chart, labs and discussed the procedure including the risks, benefits and alternatives for the proposed anesthesia with the patient or authorized representative who has indicated his/her understanding and acceptance.     Dental advisory given  Plan Discussed with: CRNA  Anesthesia Plan Comments:         Anesthesia Quick Evaluation

## 2021-01-21 ENCOUNTER — Inpatient Hospital Stay (HOSPITAL_COMMUNITY): Payer: No Typology Code available for payment source | Admitting: Certified Registered Nurse Anesthetist

## 2021-01-21 ENCOUNTER — Other Ambulatory Visit: Payer: Self-pay

## 2021-01-21 ENCOUNTER — Inpatient Hospital Stay (HOSPITAL_COMMUNITY): Payer: No Typology Code available for payment source

## 2021-01-21 ENCOUNTER — Encounter (HOSPITAL_COMMUNITY): Admission: RE | Disposition: A | Discharge status: Home / Self Care | Payer: Self-pay | Source: Home / Self Care

## 2021-01-21 ENCOUNTER — Inpatient Hospital Stay (HOSPITAL_COMMUNITY)
Admission: RE | Admit: 2021-01-21 | Discharge: 2021-01-26 | DRG: 415 | Disposition: A | Discharge status: Home / Self Care | Payer: No Typology Code available for payment source | Attending: General Surgery | Admitting: General Surgery

## 2021-01-21 ENCOUNTER — Encounter (HOSPITAL_COMMUNITY): Payer: Self-pay | Admitting: Surgery

## 2021-01-21 DIAGNOSIS — K219 Gastro-esophageal reflux disease without esophagitis: Secondary | ICD-10-CM | POA: Diagnosis present

## 2021-01-21 DIAGNOSIS — K8012 Calculus of gallbladder with acute and chronic cholecystitis without obstruction: Principal | ICD-10-CM | POA: Diagnosis present

## 2021-01-21 DIAGNOSIS — Z01818 Encounter for other preprocedural examination: Secondary | ICD-10-CM

## 2021-01-21 DIAGNOSIS — Z20822 Contact with and (suspected) exposure to covid-19: Secondary | ICD-10-CM | POA: Diagnosis present

## 2021-01-21 DIAGNOSIS — K823 Fistula of gallbladder: Secondary | ICD-10-CM | POA: Diagnosis present

## 2021-01-21 DIAGNOSIS — K828 Other specified diseases of gallbladder: Secondary | ICD-10-CM | POA: Diagnosis present

## 2021-01-21 DIAGNOSIS — R109 Unspecified abdominal pain: Secondary | ICD-10-CM | POA: Diagnosis present

## 2021-01-21 DIAGNOSIS — Z8249 Family history of ischemic heart disease and other diseases of the circulatory system: Secondary | ICD-10-CM

## 2021-01-21 DIAGNOSIS — F17210 Nicotine dependence, cigarettes, uncomplicated: Secondary | ICD-10-CM | POA: Diagnosis present

## 2021-01-21 DIAGNOSIS — Z79899 Other long term (current) drug therapy: Secondary | ICD-10-CM

## 2021-01-21 DIAGNOSIS — Z5331 Laparoscopic surgical procedure converted to open procedure: Secondary | ICD-10-CM | POA: Diagnosis not present

## 2021-01-21 DIAGNOSIS — K8020 Calculus of gallbladder without cholecystitis without obstruction: Secondary | ICD-10-CM

## 2021-01-21 DIAGNOSIS — Z833 Family history of diabetes mellitus: Secondary | ICD-10-CM

## 2021-01-21 DIAGNOSIS — Z800 Family history of malignant neoplasm of digestive organs: Secondary | ICD-10-CM | POA: Diagnosis not present

## 2021-01-21 DIAGNOSIS — K316 Fistula of stomach and duodenum: Secondary | ICD-10-CM | POA: Diagnosis present

## 2021-01-21 DIAGNOSIS — K660 Peritoneal adhesions (postprocedural) (postinfection): Secondary | ICD-10-CM | POA: Diagnosis present

## 2021-01-21 DIAGNOSIS — Z419 Encounter for procedure for purposes other than remedying health state, unspecified: Secondary | ICD-10-CM

## 2021-01-21 HISTORY — PX: CHOLECYSTECTOMY: SHX55

## 2021-01-21 LAB — LIPASE, BLOOD: Lipase: 253 U/L — ABNORMAL HIGH (ref 11–51)

## 2021-01-21 LAB — COMPREHENSIVE METABOLIC PANEL
ALT: 105 U/L — ABNORMAL HIGH (ref 0–44)
AST: 32 U/L (ref 15–41)
Albumin: 3.9 g/dL (ref 3.5–5.0)
Alkaline Phosphatase: 216 U/L — ABNORMAL HIGH (ref 38–126)
Anion gap: 5 (ref 5–15)
BUN: 13 mg/dL (ref 6–20)
CO2: 26 mmol/L (ref 22–32)
Calcium: 9 mg/dL (ref 8.9–10.3)
Chloride: 103 mmol/L (ref 98–111)
Creatinine, Ser: 0.56 mg/dL (ref 0.44–1.00)
GFR, Estimated: >60 mL/min (ref >60)
Glucose, Bld: 97 mg/dL (ref 70–99)
Potassium: 3.1 mmol/L — ABNORMAL LOW (ref 3.5–5.1)
Sodium: 134 mmol/L — ABNORMAL LOW (ref 135–145)
Total Bilirubin: 0.5 mg/dL (ref 0.3–1.2)
Total Protein: 8.7 g/dL — ABNORMAL HIGH (ref 6.5–8.1)

## 2021-01-21 LAB — PREGNANCY, URINE: Preg Test, Ur: NEGATIVE

## 2021-01-21 LAB — SARS CORONAVIRUS 2 BY RT PCR (HOSPITAL ORDER, PERFORMED IN ~~LOC~~ HOSPITAL LAB): SARS Coronavirus 2: NEGATIVE

## 2021-01-21 SURGERY — LAPAROSCOPIC CHOLECYSTECTOMY WITH INTRAOPERATIVE CHOLANGIOGRAM
Anesthesia: General | Site: Abdomen

## 2021-01-21 MED ORDER — DEXAMETHASONE SODIUM PHOSPHATE 10 MG/ML IJ SOLN
INTRAMUSCULAR | Status: DC | PRN
  Administered 2021-01-21: 5 mg via INTRAVENOUS

## 2021-01-21 MED ORDER — FENTANYL CITRATE (PF) 100 MCG/2ML IJ SOLN
INTRAMUSCULAR | Status: DC | PRN
  Administered 2021-01-21 (×4): 50 ug via INTRAVENOUS
  Administered 2021-01-21: 100 ug via INTRAVENOUS
  Administered 2021-01-21 (×2): 50 ug via INTRAVENOUS

## 2021-01-21 MED ORDER — PROMETHAZINE HCL 25 MG/ML IJ SOLN: 6.2500 mg | INTRAMUSCULAR | Status: DC | PRN

## 2021-01-21 MED ORDER — BUPIVACAINE LIPOSOME 1.3 % IJ SUSP
INTRAMUSCULAR | Status: AC
  Filled 2021-01-21: qty 20

## 2021-01-21 MED ORDER — ONDANSETRON HCL 4 MG/2ML IJ SOLN: 4.0000 mg | Freq: Four times a day (QID) | INTRAMUSCULAR | Status: DC | PRN

## 2021-01-21 MED ORDER — CHLORHEXIDINE GLUCONATE 0.12 % MT SOLN
15.0000 mL | Freq: Once | OROMUCOSAL | Status: AC
  Administered 2021-01-21: 15 mL via OROMUCOSAL

## 2021-01-21 MED ORDER — LIP MEDEX EX OINT: TOPICAL_OINTMENT | CUTANEOUS | Status: DC | PRN

## 2021-01-21 MED ORDER — GABAPENTIN 300 MG PO CAPS
300.0000 mg | ORAL_CAPSULE | ORAL | Status: AC
  Administered 2021-01-21: 300 mg via ORAL
  Filled 2021-01-21: qty 1

## 2021-01-21 MED ORDER — BUPIVACAINE-EPINEPHRINE (PF) 0.25% -1:200000 IJ SOLN
INTRAMUSCULAR | Status: AC
  Filled 2021-01-21: qty 30

## 2021-01-21 MED ORDER — DIPHENHYDRAMINE HCL 50 MG/ML IJ SOLN: 25.0000 mg | Freq: Four times a day (QID) | INTRAMUSCULAR | Status: DC | PRN

## 2021-01-21 MED ORDER — ENOXAPARIN SODIUM 40 MG/0.4ML IJ SOSY
40.0000 mg | PREFILLED_SYRINGE | INTRAMUSCULAR | Status: DC
Start: 2021-01-22 — End: 2021-01-26
  Administered 2021-01-22 – 2021-01-26 (×5): 40 mg via SUBCUTANEOUS
  Filled 2021-01-21 (×6): qty 0.4

## 2021-01-21 MED ORDER — PANTOPRAZOLE SODIUM 40 MG IV SOLR
40.0000 mg | Freq: Every day | INTRAVENOUS | Status: DC
  Administered 2021-01-21 – 2021-01-22 (×2): 40 mg via INTRAVENOUS
  Filled 2021-01-21 (×2): qty 40

## 2021-01-21 MED ORDER — HEMOSTATIC AGENTS (NO CHARGE) OPTIME
TOPICAL | Status: DC | PRN
  Administered 2021-01-21: 1 via TOPICAL

## 2021-01-21 MED ORDER — SCOPOLAMINE 1 MG/3DAYS TD PT72
1.0000 | MEDICATED_PATCH | Freq: Once | TRANSDERMAL | Status: AC
  Administered 2021-01-21: 1.5 mg via TRANSDERMAL
  Filled 2021-01-21: qty 1

## 2021-01-21 MED ORDER — ROCURONIUM BROMIDE 10 MG/ML (PF) SYRINGE
PREFILLED_SYRINGE | INTRAVENOUS | Status: DC | PRN
  Administered 2021-01-21 (×2): 20 mg via INTRAVENOUS
  Administered 2021-01-21: 60 mg via INTRAVENOUS

## 2021-01-21 MED ORDER — FENTANYL CITRATE (PF) 100 MCG/2ML IJ SOLN
INTRAMUSCULAR | Status: AC
  Filled 2021-01-21: qty 2

## 2021-01-21 MED ORDER — DEXAMETHASONE SODIUM PHOSPHATE 10 MG/ML IJ SOLN
INTRAMUSCULAR | Status: AC
  Filled 2021-01-21: qty 1

## 2021-01-21 MED ORDER — METHOCARBAMOL 500 MG IVPB - SIMPLE MED
500.0000 mg | Freq: Three times a day (TID) | INTRAVENOUS | Status: DC
  Administered 2021-01-21 – 2021-01-26 (×16): 500 mg via INTRAVENOUS
  Filled 2021-01-21 (×6): qty 500
  Filled 2021-01-21: qty 50
  Filled 2021-01-21 (×5): qty 500
  Filled 2021-01-21: qty 50
  Filled 2021-01-21 (×3): qty 500
  Filled 2021-01-21 (×2): qty 50

## 2021-01-21 MED ORDER — OXYCODONE HCL 5 MG/5ML PO SOLN: 5.0000 mg | Freq: Once | ORAL | Status: DC | PRN

## 2021-01-21 MED ORDER — PROPOFOL 10 MG/ML IV BOLUS
INTRAVENOUS | Status: AC
  Filled 2021-01-21: qty 20

## 2021-01-21 MED ORDER — ROCURONIUM BROMIDE 10 MG/ML (PF) SYRINGE
PREFILLED_SYRINGE | INTRAVENOUS | Status: AC
  Filled 2021-01-21: qty 10

## 2021-01-21 MED ORDER — ORAL CARE MOUTH RINSE: 15.0000 mL | Freq: Once | OROMUCOSAL | Status: AC

## 2021-01-21 MED ORDER — FENTANYL CITRATE PF 50 MCG/ML IJ SOSY
PREFILLED_SYRINGE | INTRAMUSCULAR | Status: AC
  Filled 2021-01-21: qty 2

## 2021-01-21 MED ORDER — METHOCARBAMOL 500 MG IVPB - SIMPLE MED
INTRAVENOUS | Status: AC
  Filled 2021-01-21: qty 50

## 2021-01-21 MED ORDER — ACETAMINOPHEN 500 MG PO TABS
1000.0000 mg | ORAL_TABLET | Freq: Once | ORAL | Status: DC
  Filled 2021-01-21: qty 2

## 2021-01-21 MED ORDER — OXYCODONE HCL 5 MG PO TABS: 5.0000 mg | ORAL_TABLET | Freq: Once | ORAL | Status: DC | PRN

## 2021-01-21 MED ORDER — FENTANYL CITRATE PF 50 MCG/ML IJ SOSY
25.0000 ug | PREFILLED_SYRINGE | INTRAMUSCULAR | Status: DC | PRN
  Administered 2021-01-21 (×3): 50 ug via INTRAVENOUS

## 2021-01-21 MED ORDER — MIDAZOLAM HCL 2 MG/2ML IJ SOLN
INTRAMUSCULAR | Status: AC
  Filled 2021-01-21: qty 2

## 2021-01-21 MED ORDER — ONDANSETRON HCL 4 MG/2ML IJ SOLN
INTRAMUSCULAR | Status: DC | PRN
  Administered 2021-01-21: 4 mg via INTRAVENOUS

## 2021-01-21 MED ORDER — FENTANYL CITRATE PF 50 MCG/ML IJ SOSY
PREFILLED_SYRINGE | INTRAMUSCULAR | Status: AC
  Filled 2021-01-21: qty 1

## 2021-01-21 MED ORDER — SUGAMMADEX SODIUM 200 MG/2ML IV SOLN
INTRAVENOUS | Status: DC | PRN
  Administered 2021-01-21: 200 mg via INTRAVENOUS

## 2021-01-21 MED ORDER — ONDANSETRON HCL 4 MG/2ML IJ SOLN
INTRAMUSCULAR | Status: AC
  Filled 2021-01-21: qty 2

## 2021-01-21 MED ORDER — PHENOL 1.4 % MT LIQD: 1.0000 | OROMUCOSAL | Status: DC | PRN

## 2021-01-21 MED ORDER — MIDAZOLAM HCL 5 MG/5ML IJ SOLN
INTRAMUSCULAR | Status: DC | PRN
Start: 2021-01-21 — End: 2021-01-21
  Administered 2021-01-21: 2 mg via INTRAVENOUS

## 2021-01-21 MED ORDER — KCL IN DEXTROSE-NACL 20-5-0.45 MEQ/L-%-% IV SOLN
INTRAVENOUS | Status: DC
Start: 2021-01-21 — End: 2021-01-26
  Filled 2021-01-21 (×8): qty 1000

## 2021-01-21 MED ORDER — BUPIVACAINE-EPINEPHRINE 0.25% -1:200000 IJ SOLN
INTRAMUSCULAR | Status: DC | PRN
  Administered 2021-01-21: 15 mL

## 2021-01-21 MED ORDER — BUPIVACAINE LIPOSOME 1.3 % IJ SUSP
INTRAMUSCULAR | Status: DC | PRN
  Administered 2021-01-21: 20 mL

## 2021-01-21 MED ORDER — HYDROMORPHONE 1 MG/ML IV SOLN
INTRAVENOUS | Status: DC
  Administered 2021-01-21: 30 mg via INTRAVENOUS
  Administered 2021-01-21: 2.8 mg via INTRAVENOUS
  Administered 2021-01-21: 1.4 mg via INTRAVENOUS
  Administered 2021-01-21 – 2021-01-22 (×2): 1.2 mg via INTRAVENOUS
  Administered 2021-01-22: 2.2 mg via INTRAVENOUS
  Administered 2021-01-22: 3 mg via INTRAVENOUS
  Administered 2021-01-22: 1.2 mg via INTRAVENOUS
  Administered 2021-01-23: 2.6 mg via INTRAVENOUS
  Administered 2021-01-23: 30 mg via INTRAVENOUS
  Administered 2021-01-23: 1.4 mg via INTRAVENOUS
  Administered 2021-01-23: 3.2 mg via INTRAVENOUS
  Administered 2021-01-23: 0 mL via INTRAVENOUS
  Administered 2021-01-23: 1 mL via INTRAVENOUS
  Administered 2021-01-24: 0.6 mg via INTRAVENOUS
  Administered 2021-01-24: 0.4 mg via INTRAVENOUS
  Administered 2021-01-24: 1.2 mL via INTRAVENOUS
  Administered 2021-01-24: 0.6 mg via INTRAVENOUS
  Administered 2021-01-25: 1.4 mg via INTRAVENOUS
  Filled 2021-01-21 (×12): qty 30

## 2021-01-21 MED ORDER — METRONIDAZOLE 500 MG/100ML IV SOLN
500.0000 mg | Freq: Once | INTRAVENOUS | Status: AC
  Administered 2021-01-21: 500 mg via INTRAVENOUS

## 2021-01-21 MED ORDER — 0.9 % SODIUM CHLORIDE (POUR BTL) OPTIME
TOPICAL | Status: DC | PRN
  Administered 2021-01-21: 350 mL

## 2021-01-21 MED ORDER — SODIUM CHLORIDE (PF) 0.9 % IJ SOLN
INTRAMUSCULAR | Status: DC | PRN
  Administered 2021-01-21: 12 mL

## 2021-01-21 MED ORDER — METRONIDAZOLE 500 MG/100ML IV SOLN
INTRAVENOUS | Status: AC
  Filled 2021-01-21: qty 100

## 2021-01-21 MED ORDER — KETOROLAC TROMETHAMINE 30 MG/ML IJ SOLN
INTRAMUSCULAR | Status: DC | PRN
  Administered 2021-01-21: 30 mg via INTRAVENOUS

## 2021-01-21 MED ORDER — NALOXONE HCL 0.4 MG/ML IJ SOLN: 0.4000 mg | INTRAMUSCULAR | Status: DC | PRN

## 2021-01-21 MED ORDER — LIDOCAINE HCL (PF) 2 % IJ SOLN
INTRAMUSCULAR | Status: AC
  Filled 2021-01-21: qty 5

## 2021-01-21 MED ORDER — KETOROLAC TROMETHAMINE 15 MG/ML IJ SOLN
15.0000 mg | Freq: Three times a day (TID) | INTRAMUSCULAR | Status: DC
  Administered 2021-01-21 – 2021-01-22 (×5): 15 mg via INTRAVENOUS
  Filled 2021-01-21 (×6): qty 1

## 2021-01-21 MED ORDER — LIDOCAINE 2% (20 MG/ML) 5 ML SYRINGE
INTRAMUSCULAR | Status: DC | PRN
  Administered 2021-01-21: 80 mg via INTRAVENOUS

## 2021-01-21 MED ORDER — SODIUM CHLORIDE 0.9% FLUSH: 9.0000 mL | INTRAVENOUS | Status: DC | PRN

## 2021-01-21 MED ORDER — CEFAZOLIN SODIUM-DEXTROSE 2-4 GM/100ML-% IV SOLN
2.0000 g | INTRAVENOUS | Status: AC
  Administered 2021-01-21: 2 g via INTRAVENOUS
  Filled 2021-01-21: qty 100

## 2021-01-21 MED ORDER — PROPOFOL 10 MG/ML IV BOLUS
INTRAVENOUS | Status: DC | PRN
  Administered 2021-01-21: 140 mg via INTRAVENOUS
  Administered 2021-01-21: 40 mg via INTRAVENOUS

## 2021-01-21 MED ORDER — LACTATED RINGERS IV SOLN: INTRAVENOUS | Status: DC

## 2021-01-21 MED ORDER — ACETAMINOPHEN 500 MG PO TABS
1000.0000 mg | ORAL_TABLET | ORAL | Status: AC
  Administered 2021-01-21: 1000 mg via ORAL

## 2021-01-21 SURGICAL SUPPLY — 68 items
ADH SKN CLS APL DERMABOND .7 (GAUZE/BANDAGES/DRESSINGS) ×1
APL PRP STRL LF DISP 70% ISPRP (MISCELLANEOUS) ×1
APPLIER CLIP 5 13 M/L LIGAMAX5 (MISCELLANEOUS) ×2
APR CLP MED LRG 5 ANG JAW (MISCELLANEOUS) ×1
BAG COUNTER SPONGE SURGICOUNT (BAG) IMPLANT
BAG SPEC RTRVL LRG 6X4 10 (ENDOMECHANICALS) ×1
BAG SPNG CNTER NS LX DISP (BAG)
BLADE SURG SZ10 CARB STEEL (BLADE) ×1 IMPLANT
CHLORAPREP W/TINT 26 (MISCELLANEOUS) ×3 IMPLANT
CLIP APPLIE 5 13 M/L LIGAMAX5 (MISCELLANEOUS) ×2 IMPLANT
COVER SURGICAL LIGHT HANDLE (MISCELLANEOUS) ×3 IMPLANT
DECANTER SPIKE VIAL GLASS SM (MISCELLANEOUS) ×3 IMPLANT
DERMABOND ADVANCED (GAUZE/BANDAGES/DRESSINGS) ×1
DERMABOND ADVANCED .7 DNX12 (GAUZE/BANDAGES/DRESSINGS) ×2 IMPLANT
DRAIN CHANNEL 19F RND (DRAIN) ×1 IMPLANT
DRAPE C-ARM 42X120 X-RAY (DRAPES) ×1 IMPLANT
DRAPE SHEET LG 3/4 BI-LAMINATE (DRAPES) ×1 IMPLANT
DRSG TEGADERM 4X4.75 (GAUZE/BANDAGES/DRESSINGS) ×1 IMPLANT
ELECT L-HOOK LAP 45CM DISP (ELECTROSURGICAL)
ELECT PENCIL ROCKER SW 15FT (MISCELLANEOUS) ×3 IMPLANT
ELECT REM PT RETURN 15FT ADLT (MISCELLANEOUS) ×3 IMPLANT
ELECTRODE L-HOOK LAP 45CM DISP (ELECTROSURGICAL) IMPLANT
EVACUATOR SILICONE 100CC (DRAIN) ×1 IMPLANT
GAUZE SPONGE 2X2 8PLY STRL LF (GAUZE/BANDAGES/DRESSINGS) IMPLANT
GLOVE SURG POLYISO LF SZ5.5 (GLOVE) ×4 IMPLANT
GLOVE SURG UNDER POLY LF SZ6 (GLOVE) ×3 IMPLANT
GOWN STRL REUS W/TWL LRG LVL3 (GOWN DISPOSABLE) ×3 IMPLANT
GOWN STRL REUS W/TWL XL LVL3 (GOWN DISPOSABLE) ×6 IMPLANT
GRASPER SUT TROCAR 14GX15 (MISCELLANEOUS) IMPLANT
HANDLE SUCTION POOLE (INSTRUMENTS) IMPLANT
HEMOSTAT ARISTA ABSORB 3G PWDR (HEMOSTASIS) ×1 IMPLANT
HEMOSTAT SNOW SURGICEL 2X4 (HEMOSTASIS) IMPLANT
IRRIG SUCT STRYKERFLOW 2 WTIP (MISCELLANEOUS) ×2
IRRIGATION SUCT STRKRFLW 2 WTP (MISCELLANEOUS) ×2 IMPLANT
KIT BASIN OR (CUSTOM PROCEDURE TRAY) ×3 IMPLANT
KIT TURNOVER KIT A (KITS) ×1 IMPLANT
L-HOOK LAP DISP 36CM (ELECTROSURGICAL) ×2
LHOOK LAP DISP 36CM (ELECTROSURGICAL) ×2 IMPLANT
NDL INSUFFLATION 14GA 120MM (NEEDLE) IMPLANT
NEEDLE INSUFFLATION 14GA 120MM (NEEDLE) IMPLANT
POUCH SPECIMEN RETRIEVAL 10MM (ENDOMECHANICALS) ×3 IMPLANT
RETRACTOR WND ALEXIS 25 LRG (MISCELLANEOUS) IMPLANT
RTRCTR WOUND ALEXIS 25CM LRG (MISCELLANEOUS) ×2
SCISSORS LAP 5X35 DISP (ENDOMECHANICALS) ×3 IMPLANT
SET CHOLANGIOGRAPH MIX (MISCELLANEOUS) ×1 IMPLANT
SET TUBE SMOKE EVAC HIGH FLOW (TUBING) ×3 IMPLANT
SLEEVE XCEL OPT CAN 5 100 (ENDOMECHANICALS) ×6 IMPLANT
SPONGE GAUZE 2X2 STER 10/PKG (GAUZE/BANDAGES/DRESSINGS) ×1
SPONGE T-LAP 18X18 ~~LOC~~+RFID (SPONGE) ×4 IMPLANT
STAPLER VISISTAT 35W (STAPLE) ×1 IMPLANT
SUCTION POOLE HANDLE (INSTRUMENTS) ×2
SUT ETHILON 2 0 PS N (SUTURE) ×2 IMPLANT
SUT MNCRL AB 4-0 PS2 18 (SUTURE) ×3 IMPLANT
SUT PDS AB 1 TP1 96 (SUTURE) ×2 IMPLANT
SUT SILK 2 0 SH (SUTURE) ×1 IMPLANT
SUT SILK 3 0 SH CR/8 (SUTURE) ×1 IMPLANT
SUT VIC AB 3-0 SH 18 (SUTURE) ×2 IMPLANT
SUT VIC AB 3-0 SH 27 (SUTURE) ×6
SUT VIC AB 3-0 SH 27X BRD (SUTURE) IMPLANT
SUT VIC AB 3-0 SH 27XBRD (SUTURE) IMPLANT
TOWEL OR 17X26 10 PK STRL BLUE (TOWEL DISPOSABLE) ×3 IMPLANT
TOWEL OR NON WOVEN STRL DISP B (DISPOSABLE) ×1 IMPLANT
TRAY LAPAROSCOPIC (CUSTOM PROCEDURE TRAY) ×3 IMPLANT
TROCAR BLADELESS OPT 5 100 (ENDOMECHANICALS) ×3 IMPLANT
TROCAR XCEL 12X100 BLDLESS (ENDOMECHANICALS) IMPLANT
TROCAR XCEL BLUNT TIP 100MML (ENDOMECHANICALS) ×1 IMPLANT
TUBING CONNECTING 10 (TUBING) ×1 IMPLANT
YANKAUER SUCT BULB TIP NO VENT (SUCTIONS) ×1 IMPLANT

## 2021-01-21 NOTE — Transfer of Care (Signed)
Immediate Anesthesia Transfer of Care Note  Patient: Kelli Russo  Procedure(s) Performed: LAPAROSCOPIC CHOLECYSTECTOMY WITH INTRAOPERATIVE CHOLANGIOGRAM CONVERTED TO OPEN AT 10:00AM (Abdomen)  Patient Location: PACU  Anesthesia Type:General  Level of Consciousness: awake, drowsy and patient cooperative  Airway & Oxygen Therapy: Patient Spontanous Breathing and Patient connected to nasal cannula oxygen  Post-op Assessment: Report given to RN and Post -op Vital signs reviewed and stable  Post vital signs: Reviewed and stable  Last Vitals:  Vitals Value Taken Time  BP 149/92 01/21/21 1217  Temp    Pulse 88 01/21/21 1224  Resp 20 01/21/21 1224  SpO2 100 % 01/21/21 1224  Vitals shown include unvalidated device data.  Last Pain:  Vitals:   01/21/21 0745  TempSrc:   PainSc: 3          Complications: No notable events documented.

## 2021-01-21 NOTE — OR Nursing (Signed)
During Laparoscopic cholecystectomy, there was a break of an instrument, one jaw of the "Million Dollar" instrument broke away and fell into the abdomen, the instrument jaw was retrieved, a flat abdomen x-ray was taken, and it was verified by the Radiologist that no instrument pieces were left in the abdomen.

## 2021-01-21 NOTE — Anesthesia Postprocedure Evaluation (Signed)
Anesthesia Post Note  Patient: Kelli Russo  Procedure(s) Performed: LAPAROSCOPIC CHOLECYSTECTOMY WITH INTRAOPERATIVE CHOLANGIOGRAM CONVERTED TO OPEN AT 10:00AM (Abdomen)     Patient location during evaluation: PACU Anesthesia Type: General Level of consciousness: awake and alert Pain management: pain level controlled Vital Signs Assessment: post-procedure vital signs reviewed and stable Respiratory status: spontaneous breathing, nonlabored ventilation and respiratory function stable Cardiovascular status: blood pressure returned to baseline Postop Assessment: no apparent nausea or vomiting Anesthetic complications: no   No notable events documented.  Last Vitals:  Vitals:   01/21/21 1300 01/21/21 1335  BP: (!) 153/93   Pulse: 75   Resp: 17 20  Temp:    SpO2: 100% 100%               Shanda Howells

## 2021-01-21 NOTE — H&P (Signed)
Kelli Russo 07-26-1980  [id number].    HPI:  Kelli Russo is a 41 yo female who has had abdominal pain and bloating for years. She has been followed by GI, and symptoms have not improved with PPI therapy. She underwent an EGD and colonoscopy on 11/24/20. The stomach and esophagus were normal and biopsies were negative for H pylori. There were small duodenal polyps, biopsies of which showed mild duodenitis with hypertrophic Brunner's glands. She has continued to have pain, and a RUQ [place] was done on 12/1. This showed numerous stones within the gallbladder, and a positive sonographic Eulah Pont sign was reported.  Today, she reports she developed severe pain after eating pizza last week. The pain has been more severe since then. It is in the epigastric area but radiates to both upper quadrants. She says the pain usually occurs after eating fatty or greasy foods. She has not had any fevers.  She has not had any prior abdominal surgeries and is otherwise in good health.  ROS: Review of Systems  Constitutional:  Negative for chills and fever.  HENT:  Negative for sore throat.   Eyes:  Negative for redness.  Respiratory:  Negative for shortness of breath, wheezing and stridor.   Cardiovascular:  Negative for chest pain.  Gastrointestinal:  Positive for abdominal pain. Negative for nausea and vomiting.  Skin:  Negative for itching.  Neurological:  Negative for seizures and loss of consciousness.   Family History  Problem Relation Age of Onset   Diabetes Mother    CAD Mother    Cancer Father    Diabetes Father    Colon cancer Father    Esophageal cancer Neg Hx    Rectal cancer Neg Hx    Stomach cancer Neg Hx     Past Medical History:  Diagnosis Date   Anemia    Anxiety    Depression    GERD (gastroesophageal reflux disease)     Past Surgical History:  Procedure Laterality Date   WISDOM TOOTH EXTRACTION      Social History:  reports that she has been smoking cigarettes. She has never  used smokeless tobacco. She reports that she does not currently use alcohol. She reports that she does not use drugs.  Allergies: No Known Allergies  Medications Prior to Admission  Medication Sig Dispense Refill   dicyclomine (BENTYL) 20 MG tablet TAKE 1 TABLET(20 MG) BY MOUTH EVERY 4 HOURS (Patient taking differently: Take 20 mg by mouth every 4 (four) hours as needed for spasms.) 30 tablet 1   ibuprofen (ADVIL,MOTRIN) 200 MG tablet Take 400 mg by mouth every 8 (eight) hours as needed for moderate pain.     pantoprazole (PROTONIX) 20 MG tablet Take 1 tablet (20 mg total) by mouth 2 (two) times daily. 60 tablet 1     Physical Exam: Blood pressure 133/85, pulse 88, temperature 98.5 F (36.9 C), temperature source Oral, resp. rate 15, SpO2 100 %. General: resting comfortably, appears stated age, no apparent distress Neurological: alert and oriented, no focal deficits, cranial nerves grossly in tact HEENT: normocephalic, atraumatic, oropharynx clear, no scleral icterus CV: extremities warm and well-perfused Respiratory: normal work of breathing, symmetric chest wall expansion Abdomen: soft, nondistended, mild epigastric tenderness to palpation. No masses or organomegaly. No surgical scars. Extremities: warm and well-perfused, no deformities, moving all extremities spontaneously Psychiatric: normal mood and affect Skin: warm and dry, no jaundice, no rashes or lesions   Results for orders placed or performed during the hospital encounter  of 01/21/21 (from the past 48 hour(s))  Pregnancy, urine per protocol     Status: None   Collection Time: 01/21/21  7:19 AM  Result Value Ref Range   Preg Test, Ur NEGATIVE NEGATIVE    Comment:        THE SENSITIVITY OF THIS METHODOLOGY IS >20 mIU/mL. Performed at [facility], [address] [place], [place] [address]    No results found.    Assessment/Plan 41 yo female with symptomatic cholelithiasis. I personally reviewed  her [place], which shows numerous shadowing stones within the gallbladder, but no pericholecystic fluid to suggest acute cholecystitis. The CBD was mildly dilated. Will proceed to the OR for laparoscopic cholecystectomy with intraoperative cholangiogram. Plan for discharge home from PACU unless IOC is positive. The details of the surgery, including benefits and risks of bleeding, infection and bile duct injury were discussed with the patient and she agrees to proceed with surgery. Informed consent obtained.   Sophronia Simas, MD [facility] Surgery General, Hepatobiliary and Pancreatic Surgery 01/21/21 7:37 AM

## 2021-01-21 NOTE — Anesthesia Procedure Notes (Signed)
Procedure Name: Intubation Date/Time: 01/21/2021 9:12 AM Performed by: West Pugh, CRNA Pre-anesthesia Checklist: Patient identified, Emergency Drugs available, Suction available, Patient being monitored and Timeout performed Patient Re-evaluated:Patient Re-evaluated prior to induction Oxygen Delivery Method: Circle system utilized Preoxygenation: Pre-oxygenation with 100% oxygen Induction Type: IV induction Ventilation: Mask ventilation without difficulty Laryngoscope Size: Mac and 3 Grade View: Grade I Tube type: Oral Tube size: 7.0 mm Number of attempts: 1 Airway Equipment and Method: Stylet Placement Confirmation: ETT inserted through vocal cords under direct vision, positive ETCO2, CO2 detector and breath sounds checked- equal and bilateral Secured at: 21 cm Tube secured with: Tape Dental Injury: Teeth and Oropharynx as per pre-operative assessment

## 2021-01-21 NOTE — Op Note (Signed)
Date: 01/21/21  Patient: Kelli Russo MRN: [id number]  Preoperative Diagnosis: Symptomatic cholelithiasis Postoperative Diagnosis: Symptomatic cholelithiasis, chronic cholecystitis, cholecystoduodenal fistula  Procedure:  Laparoscopic converted to open cholecystectomy Intraoperative cholangiogram Primary repair of duodenal fistula with placement of a falciform ligament flap  Surgeon: Sophronia Simas, MD Assistant: Romie Levee, MD  EBL: 150 mL  Anesthesia: General endotracheal  Specimens: Gallbladder  Indications: [name] is a 41 yo female who has had chronic upper abdominal pain for several years and has previously had work-up with an EGD, which was unrevealing.  She has had worsening pain in the last few weeks, particularly over the last week.  An ultrasound last month showed numerous stones within the gallbladder.  After discussion of the risks and benefits of surgery, she agreed to proceed with cholecystectomy.  Findings: Significant thickening and fibrosis of the gallbladder wall consistent with chronic cholecystitis, with fistulization of the duodenal bulb to the body of the gallbladder.  The gallbladder was contracted and fibrotic with chronic occlusion of the cystic duct. Mild extrahepatic biliary ductal dilation but otherwise normal cholangiogram.  Procedure details: Informed consent was obtained in the preoperative area prior to the procedure. The patient was brought to the operating room and placed on the table in the supine position. General anesthesia was induced and appropriate lines and drains were placed for intraoperative monitoring. Perioperative antibiotics were administered per SCIP guidelines. The abdomen was prepped and draped in the usual sterile fashion. A pre-procedure timeout was taken verifying patient identity, surgical site and procedure to be performed.  A small infraumbilical skin incision was made, the subcutaneous tissue was divided with cautery, and the  umbilical stalk was grasped and elevated. The fascia was incised and the peritoneal cavity was directly visualized. A 36mm Hassan trocar was placed. The peritoneal cavity was inspected with no evidence of visceral or vascular injury. Three 97mm ports were placed in the right subcostal margin, all under direct visualization. The fundus of the gallbladder was grasped and retracted cephalad.  There were significant omental adhesions to the dome of the gallbladder.  These adhesions were carefully taken down using cautery and blunt dissection.  As the adhesions were taken down it was evident that the proximal duodenum was densely adherent to the body of the gallbladder.  It was very difficult to find a plane between the duodenum and the gallbladder, and the duodenum was taken off the gallbladder wall using sharp dissection which resulted in some bleeding.  On further dissection of the duodenum off the gallbladder, it became apparent that the duodenum had fistulized to the gallbladder.  Duodenal mucosa was visible, as well as the lumen of the gallbladder, and there was surrounding ulcerated tissue consistent with a chronic fistula.  The duodenum was further dissected off the gallbladder using sharp dissection.  After the duodenum had been completely taken off the gallbladder, there appeared to be an approximately 1 cm defect on the anterior duodenal bulb.  The fundus of the gallbladder was then grasped and retracted cephalad.  The cystic triangle was very fibrotic and attempts were made to dissect out the cystic duct but this was very difficult due to the degree of chronic inflammation.  At this point the decision was made to convert to laparotomy.  The ports were removed and the abdomen was desufflated.  An upper midline skin incision was made and the subcutaneous tissue was divided with cautery.  The fascia was opened along the linea alba and the peritoneal cavity was entered.  The falciform  ligament was ligated with  2-0 silk ties and then taken down off the abdominal wall with cautery.  An Allexis wound protector and Bookwalter fixed retractor were placed.  The gallbladder was taken off the liver piecemeal in a dome down fashion.  The wall of the gallbladder was very fibrotic and the plane between the liver and the gallbladder was not very clear.  The gallbladder lumen was entered and there was 1 large stone with numerous smaller stones.  All the stones were extracted.  We continued dissecting the gallbladder off the liver working inferiorly, and at the infundibulum the lumen was very thickened and the cystic duct appeared chronically occluded.  We were able to visualize the lumen in the infundibulum and pass a cholangiocatheter into the cystic duct.  A cholangiogram was then performed to delineate the biliary anatomy, and this showed filling of the remnant cyst duct, entire common bile duct, common hepatic duct, and left and right hepatic ducts.  There was prompt filling of the duodenum with contrast.  The common bile duct appeared mildly dilated but there were no filling defects.  The cholangiocatheter was removed.  The infundibulum of the gallbladder was then further taken off the cystic plate and the anterior and posterior branches of the cystic artery were clipped.  The cystic duct was then clamped very close to the gallbladder and the remaining gallbladder was divided proximal to the clamp using metzenbaum scissors. The cystic duct was oversewn with a 2-0 silk suture ligature. Hemostasis was achieved in the gallbladder fossa using cautery.  Next the duodenum was examined there was an approximately 1cm defect on the anterior wall just distal to the pylorus.There was ulcerated tissue at the superior edges, and this was sharply debrided back to healthy bleeding tissue. The resulting defect was approximately 1.5cm in diameter. The duodenotomy was closed transversely with interrupted 3-0 Vicryl sutures, taking full-thickness  bites. I felt that placing an outer later of Lembert sutures would create tension and potentially narrow the lumen of the duodenum, so instead a falciform ligament flap was mobilized and placed over the duodenal repair. The tails of the Vicryl sutures were tied down over the flap loosely to secure it in place while maintaining perfusion to the flap. The repair was closely examined and appeared in tact with no evidence of leak. The abdomen was irrigated with several liters of warmed saline and appeared hemostatic. Arista was placed in the gallbladder fossa. A 19-Fr JP drain was placed adjacent to the duodenal repair and gallbladder fossa and brought through the more medial subcostal port site. It was secured to the skin with 2-0 Nylon suture. The nasogastric tube tip was palpated within the stomach in appropriate position.  The retractors and wound protector were removed. The fascia was closed with a running looped 1 PDS suture. The subcutaneous tissue was irrigated and Scarpa's layer was closed with a running 3-0 Vicryl suture. The deep dermis was closed with a running 3-0 Vicryl suture and the skin was closed with a running subcuticular 4-0 monocryl suture. The umbilical port site fascia was closed with a 0 Vicryl suture, and the lateral subcostal port site was closed with a 4-0 monocryl subcuticular suture. Dermabond was applied.  The patient tolerated the procedure with no apparent complications. All counts were correct x2 at the end of the procedure. The patient was extubated and taken to PACU in stable condition.  Sophronia Simas, MD 01/21/21 12:21 PM

## 2021-01-22 ENCOUNTER — Encounter (HOSPITAL_COMMUNITY): Payer: Self-pay | Admitting: Surgery

## 2021-01-22 LAB — COMPREHENSIVE METABOLIC PANEL
ALT: 78 U/L — ABNORMAL HIGH (ref 0–44)
AST: 43 U/L — ABNORMAL HIGH (ref 15–41)
Albumin: 3.2 g/dL — ABNORMAL LOW (ref 3.5–5.0)
Alkaline Phosphatase: 156 U/L — ABNORMAL HIGH (ref 38–126)
Anion gap: 6 (ref 5–15)
BUN: 8 mg/dL (ref 6–20)
CO2: 28 mmol/L (ref 22–32)
Calcium: 8.7 mg/dL — ABNORMAL LOW (ref 8.9–10.3)
Chloride: 103 mmol/L (ref 98–111)
Creatinine, Ser: 0.59 mg/dL (ref 0.44–1.00)
GFR, Estimated: >60 mL/min (ref >60)
Glucose, Bld: 116 mg/dL — ABNORMAL HIGH (ref 70–99)
Potassium: 4.4 mmol/L (ref 3.5–5.1)
Sodium: 137 mmol/L (ref 135–145)
Total Bilirubin: 0.5 mg/dL (ref 0.3–1.2)
Total Protein: 6.9 g/dL (ref 6.5–8.1)

## 2021-01-22 LAB — CBC
HCT: 29.5 % — ABNORMAL LOW (ref 36.0–46.0)
Hemoglobin: 8.4 g/dL — ABNORMAL LOW (ref 12.0–15.0)
MCH: 20.9 pg — ABNORMAL LOW (ref 26.0–34.0)
MCHC: 28.5 g/dL — ABNORMAL LOW (ref 30.0–36.0)
MCV: 73.6 fL — ABNORMAL LOW (ref 80.0–100.0)
Platelets: 306 10*3/uL (ref 150–400)
RBC: 4.01 MIL/uL (ref 3.87–5.11)
RDW: 22.7 % — ABNORMAL HIGH (ref 11.5–15.5)
WBC: 14 10*3/uL — ABNORMAL HIGH (ref 4.0–10.5)
nRBC: 0 % (ref 0.0–0.2)

## 2021-01-22 LAB — GLUCOSE, CAPILLARY: Glucose-Capillary: 93 mg/dL (ref 70–99)

## 2021-01-22 LAB — SURGICAL PATHOLOGY

## 2021-01-22 MED ORDER — PANTOPRAZOLE SODIUM 40 MG IV SOLR
40.0000 mg | Freq: Two times a day (BID) | INTRAVENOUS | Status: DC
  Administered 2021-01-22 – 2021-01-26 (×8): 40 mg via INTRAVENOUS
  Filled 2021-01-22 (×8): qty 40

## 2021-01-22 MED ORDER — DIPHENHYDRAMINE HCL 50 MG/ML IJ SOLN
25.0000 mg | Freq: Four times a day (QID) | INTRAMUSCULAR | Status: DC | PRN
  Administered 2021-01-22 – 2021-01-23 (×2): 25 mg via INTRAVENOUS
  Filled 2021-01-22 (×2): qty 1

## 2021-01-22 NOTE — Progress Notes (Signed)
Transition of Care (TOC) Screening Note  Patient Details  Name: Eilah Common Date of Birth: Sep 19, 1980  Transition of Care [facility]) CM/SW Contact:    Ewing Schlein, LCSW Phone Number: 01/22/2021, 11:03 AM  Transition of Care Department [facility]) has reviewed patient and no TOC needs have been identified at this time. We will continue to monitor patient advancement through interdisciplinary progression rounds. If new patient transition needs arise, please place a TOC consult.

## 2021-01-22 NOTE — Evaluation (Signed)
Physical Therapy Evaluation Patient Details Name: Kelli Russo MRN: [id number] DOB: 1980/12/07 Today's Date: 01/22/2021  History of Present Illness  Patient is 41 y.o. female s/p lap cholecystectomy with cholangiogram and repair of duodenal fistula on 01/21/21.PMH significant for anemia, anxiety, depression, GERD.    Clinical Impression  Kelli Russo is 41 y.o. female admitted with above HPI and diagnosis. Patient is currently limited by functional impairments below (see PT problem list). Patient lives alone and is independent at baseline. Patient will benefit from continued skilled PT interventions to address impairments and progress independence with mobility. Currently pt requires min guard for transfers and gait and occasional assist for line/IV management. Anticipate pt will progress well in acute setting if she continues to mobilize with RN/NT staff and mobility team in adjunct to therapy services. Acute PT will follow and progress as able.        Recommendations for follow up therapy are one component of a multi-disciplinary discharge planning process, led by the attending physician.  Recommendations may be updated based on patient status, additional functional criteria and insurance authorization.  Follow Up Recommendations No PT follow up    Assistance Recommended at Discharge Set up Supervision/Assistance  Patient can return home with the following       Equipment Recommendations None recommended by PT  Recommendations for Other Services       Functional Status Assessment Patient has had a recent decline in their functional status and demonstrates the ability to make significant improvements in function in a reasonable and predictable amount of time.     Precautions / Restrictions Precautions Precautions: Fall Precaution Comments: NGT, JP drain Rt abd Restrictions Weight Bearing Restrictions: No      Mobility  Bed Mobility Overal bed mobility: Needs Assistance Bed  Mobility: Sidelying to Sit;Rolling;Sit to Sidelying Rolling: Supervision Sidelying to sit: Supervision;HOB elevated     Sit to sidelying: Supervision;HOB elevated General bed mobility comments: supervision, cues for sequencing log roll technique.    Transfers Overall transfer level: Needs assistance Equipment used: Rolling walker (2 wheels) Transfers: Sit to/from Stand Sit to Stand: Min guard           General transfer comment: guarding for safety, cues to use bil UE for power up. no assist needed.    Ambulation/Gait Ambulation/Gait assistance: Min guard;Min assist Gait Distance (Feet): 220 Feet Assistive device: None;IV Pole Gait Pattern/deviations: Step-through pattern;WFL(Within Functional Limits);Trunk flexed Gait velocity: decr     General Gait Details: trunk flexed intermittently due to abd pain, overall WFL and no overt LOB noted. assist for IV pole intermittently and min guard for safety  Stairs            Wheelchair Mobility    Modified Rankin (Stroke Patients Only)       Balance Overall balance assessment: Mild deficits observed, not formally tested                                           Pertinent Vitals/Pain Pain Assessment: Faces Faces Pain Scale: Hurts even more Pain Location: abdomen Pain Descriptors / Indicators: Discomfort;Guarding Pain Intervention(s): Limited activity within patient's tolerance;Monitored during session;Repositioned;PCA encouraged    Home Living Family/patient expects to be discharged to:: Private residence Living Arrangements: Alone Available Help at Discharge: Family Type of Home: Apartment Home Access: Stairs to enter Entrance Stairs-Rails: Can reach both Entrance Stairs-Number of Steps: flight (10)  Home Layout: One level Home Equipment: None      Prior Function Prior Level of Function : Independent/Modified Independent;Working/employed;Driving                     Hand  Dominance   Dominant Hand: Right    Extremity/Trunk Assessment   Upper Extremity Assessment Upper Extremity Assessment: Overall WFL for tasks assessed    Lower Extremity Assessment Lower Extremity Assessment: Overall WFL for tasks assessed    Cervical / Trunk Assessment Cervical / Trunk Assessment: Normal;Other exceptions Cervical / Trunk Exceptions: flexed posture due to abdominal pain  Communication   Communication: No difficulties  Cognition Arousal/Alertness: Awake/alert Behavior During Therapy: WFL for tasks assessed/performed Overall Cognitive Status: Within Functional Limits for tasks assessed                                          General Comments      Exercises     Assessment/Plan    PT Assessment Patient needs continued PT services  PT Problem List Decreased activity tolerance;Decreased balance;Decreased mobility;Decreased knowledge of use of DME;Decreased knowledge of precautions;Pain       PT Treatment Interventions DME instruction;Gait training;Stair training;Functional mobility training;Therapeutic activities;Therapeutic exercise;Balance training;Patient/family education    PT Goals (Current goals can be found in the Care Plan section)  Acute Rehab PT Goals Patient Stated Goal: stop hurting and get back home to dog and family PT Goal Formulation: With patient Time For Goal Achievement: 02/05/21 Potential to Achieve Goals: Good    Frequency Min 3X/week     Co-evaluation               AM-PAC PT "6 Clicks" Mobility  Outcome Measure Help needed turning from your back to your side while in a flat bed without using bedrails?: A Little Help needed moving from lying on your back to sitting on the side of a flat bed without using bedrails?: A Little Help needed moving to and from a bed to a chair (including a wheelchair)?: A Little Help needed standing up from a chair using your arms (e.g., wheelchair or bedside chair)?: A  Little Help needed to walk in hospital room?: A Little Help needed climbing 3-5 steps with a railing? : A Little 6 Click Score: 18    End of Session Equipment Utilized During Treatment: Gait belt Activity Tolerance: Patient tolerated treatment well Patient left: in bed;with call bell/phone within reach;with bed alarm set Nurse Communication: Mobility status PT Visit Diagnosis: Muscle weakness (generalized) (M62.81);Difficulty in walking, not elsewhere classified (R26.2)    Time: 9417-4081 PT Time Calculation (min) (ACUTE ONLY): 28 min   Charges:   PT Evaluation $PT Eval Low Complexity: 1 Low PT Treatments $Gait Training: 8-22 mins        Wynn Maudlin, DPT Acute Rehabilitation Services Office [phone] Pager [phone]   Anitra Lauth 01/22/2021, 11:12 AM

## 2021-01-22 NOTE — Progress Notes (Signed)
1 Day Post-Op  Subjective: No acute issues overnight. Pain adequately controlled. Denies nausea/vomiting.   Objective: Vital signs in last 24 hours: Temp:  [97.5 F (36.4 C)-98.7 F (37.1 C)] 97.6 F (36.4 C) (01/05 4235) Pulse Rate:  [55-94] 63 (01/05 0608) Resp:  [13-26] 18 (01/05 0827) BP: (110-153)/(60-93) 110/72 (01/05 0608) SpO2:  [100 %] 100 % (01/05 0827) Last BM Date: 01/20/21  Intake/Output from previous day: 01/04 0701 - 01/05 0700 In: 3140.4 [P.O.:270; I.V.:2620.4; IV Piggyback:250] Out: 1225 [Urine:700; Emesis/NG output:300; Drains:75; Blood:150] Intake/Output this shift: No intake/output data recorded.  PE: General: resting comfortably, NAD Neuro: alert and oriented, no focal deficits HEENT: NG to suction with minimal bilious drainage Resp: normal work of breathing on room air Abdomen: soft, nondistended, appropriately tender to palpation. Incisions clean and dry with no erythema or induration. RUQ JP with serosanguinous drainage. Extremities: warm and well-perfused   Lab Results:  Recent Labs    01/22/21 0504  WBC 14.0*  HGB 8.4*  HCT 29.5*  PLT 306   BMET Recent Labs    01/21/21 0740 01/22/21 0504  NA 134* 137  K 3.1* 4.4  CL 103 103  CO2 26 28  GLUCOSE 97 116*  BUN 13 8  CREATININE 0.56 0.59  CALCIUM 9.0 8.7*   PT/INR No results for input(s): LABPROT, INR in the last 72 hours. CMP     Component Value Date/Time   NA 137 01/22/2021 0504   K 4.4 01/22/2021 0504   CL 103 01/22/2021 0504   CO2 28 01/22/2021 0504   GLUCOSE 116 (H) 01/22/2021 0504   BUN 8 01/22/2021 0504   CREATININE 0.59 01/22/2021 0504   CALCIUM 8.7 (L) 01/22/2021 0504   PROT 6.9 01/22/2021 0504   ALBUMIN 3.2 (L) 01/22/2021 0504   AST 43 (H) 01/22/2021 0504   ALT 78 (H) 01/22/2021 0504   ALKPHOS 156 (H) 01/22/2021 0504   BILITOT 0.5 01/22/2021 0504   GFRNONAA >60 01/22/2021 0504   Lipase     Component Value Date/Time   LIPASE 253 (H) 01/21/2021 0740        Studies/Results: DG Abd 1 View  Result Date: 01/21/2021 CLINICAL DATA:  Broken surgical instrument. EXAM: ABDOMEN - 1 VIEW COMPARISON:  None. FINDINGS: The bowel gas pattern is normal. Status post cholecystectomy. Surgical drain is seen in right upper quadrant. Nasogastric tube is seen in stomach. No other radiopaque foreign body is noted. IMPRESSION: Surgical drain seen in right upper quadrant. Nasogastric tube tip seen in stomach. No other radiopaque foreign body is noted. These results were called by telephone at the time of interpretation on 01/21/2021 at 11:55 am to provider [facility] Venetta Knee in OR 4, who verbally acknowledged these results. Electronically Signed   By: Lupita Raider M.D.   On: 01/21/2021 11:55   DG Cholangiogram Operative  Result Date: 01/21/2021 CLINICAL DATA:  Intraoperative cholangiogram during laparoscopic cholecystectomy. EXAM: INTRAOPERATIVE CHOLANGIOGRAM FLUOROSCOPY TIME:  10 seconds COMPARISON:  Right upper quadrant abdominal ultrasound-12/18/2020 FINDINGS: Intraoperative cholangiographic images of the right upper abdominal quadrant during laparoscopic cholecystectomy are provided for review. Surgical clips overlie the expected location of the gallbladder fossa. Contrast injection demonstrates selective cannulation of the central aspect of the cystic duct. There is passage of contrast through the central aspect of the cystic duct with filling of a mildly dilated common bile duct. There is passage of contrast though the CBD and into the descending portion of the duodenum. There is minimal reflux of injected contrast into  the common hepatic duct and central aspect of the non dilated intrahepatic biliary system. There are no discrete filling defects within the opacified portions of the biliary system to suggest the presence of choledocholithiasis. IMPRESSION: No evidence of choledocholithiasis. Electronically Signed   By: Simonne Come M.D.   On: 01/21/2021 11:03   DG C-Arm  1-60 Min-No Report  Result Date: 01/21/2021 Fluoroscopy was utilized by the requesting physician.  No radiographic interpretation.        Assessment/Plan  41 yo female with chronic calculous cholecystitis, POD1 s/p laparoscopic converted to open cholecystectomy with takedown and repair of cholecystoduodenal fistula. - Remain NPO except ice chips. Continue NG decompression. - Plan for upper GI with gastrografin on POD3, keep NG until then - IV fluid hydration - Mobilize today, PT ordered - Pain control: scheduled toradol and robaxin, dilaudid PCA - VTE: lovenox, SCDs  - Dispo: inpatient, med-surg floor    LOS: 1 day    Sophronia Simas, MD [facility] Surgery General, Hepatobiliary and Pancreatic Surgery 01/22/21 9:12 AM

## 2021-01-22 NOTE — Progress Notes (Signed)
Initial Nutrition Assessment  INTERVENTION:   -Monitor for diet advancement and supplement needs  NUTRITION DIAGNOSIS:   Increased nutrient needs related to acute illness as evidenced by estimated needs.   GOAL:   Patient will meet greater than or equal to 90% of their needs  MONITOR:   Diet advancement, Labs, Weight trends, I & O's  REASON FOR ASSESSMENT:   Malnutrition Screening Tool    ASSESSMENT:   41 yo female with chronic calculous cholecystitis, POD1 s/p laparoscopic converted to open cholecystectomy with takedown and repair of cholecystoduodenal fistula.  Patient continues to be NPO with NGT set to LIS, Output at bedside: 100 ml. Pt reports her canister has not been changed recently. Denies any nausea at this time.  Reports she was eating well PTA. She tried to eat pepperoni pizza and did not tolerate and brought her in for surgery. Reports having issues with her gallbladder since November 2022.  Pt states she is expected to have NGT in until 1/7. Will monitor for diet advancement.  Pt reporting UBW of 265 lbs. But then states she now weighs 258 lbs. Weight is recorded as 157 lbs, possible pt misspoke as pt does not look >200 lbs.   Medications: D5 infusion  Labs reviewed.  NUTRITION - FOCUSED PHYSICAL EXAM:  Pt deferred, will attempt once NGT out.  Diet Order:   Diet Order             Diet NPO time specified Except for: Ice Chips  Diet effective now                   EDUCATION NEEDS:   No education needs have been identified at this time  Skin:  Skin Assessment: Skin Integrity Issues: Skin Integrity Issues:: Incisions Incisions: 1/4 abdomen  Last BM:  1/3  Height:   Ht Readings from Last 1 Encounters:  01/21/21 5\' 4"  (1.626 m)    Weight:   Wt Readings from Last 1 Encounters:  01/21/21 71.2 kg    BMI:  Body mass index is 26.95 kg/m.  Estimated Nutritional Needs:   Kcal:  1750-1950  Protein:  85-100g  Fluid:   1.9L/day  03/21/21, MS, RD, LDN Inpatient Clinical Dietitian Contact information available via Amion

## 2021-01-22 NOTE — Progress Notes (Signed)
Dr Dwain Sarna aware via phone of pt's complaint of reflux/indigestion. See new orders to increase Protonix to BID and give dose now.

## 2021-01-23 LAB — BASIC METABOLIC PANEL
Anion gap: 9 (ref 5–15)
BUN: 5 mg/dL — ABNORMAL LOW (ref 6–20)
CO2: 26 mmol/L (ref 22–32)
Calcium: 8.4 mg/dL — ABNORMAL LOW (ref 8.9–10.3)
Chloride: 101 mmol/L (ref 98–111)
Creatinine, Ser: 0.64 mg/dL (ref 0.44–1.00)
GFR, Estimated: >60 mL/min (ref >60)
Glucose, Bld: 84 mg/dL (ref 70–99)
Potassium: 3.6 mmol/L (ref 3.5–5.1)
Sodium: 136 mmol/L (ref 135–145)

## 2021-01-23 LAB — CBC
HCT: 26.2 % — ABNORMAL LOW (ref 36.0–46.0)
Hemoglobin: 7.4 g/dL — ABNORMAL LOW (ref 12.0–15.0)
MCH: 21.2 pg — ABNORMAL LOW (ref 26.0–34.0)
MCHC: 28.2 g/dL — ABNORMAL LOW (ref 30.0–36.0)
MCV: 75.1 fL — ABNORMAL LOW (ref 80.0–100.0)
Platelets: 290 10*3/uL (ref 150–400)
RBC: 3.49 MIL/uL — ABNORMAL LOW (ref 3.87–5.11)
RDW: 22.6 % — ABNORMAL HIGH (ref 11.5–15.5)
WBC: 8.5 10*3/uL (ref 4.0–10.5)
nRBC: 0 % (ref 0.0–0.2)

## 2021-01-23 MED ORDER — DIPHENHYDRAMINE HCL 50 MG/ML IJ SOLN
50.0000 mg | Freq: Four times a day (QID) | INTRAMUSCULAR | Status: DC | PRN
  Administered 2021-01-23: 50 mg via INTRAVENOUS
  Filled 2021-01-23: qty 1

## 2021-01-23 MED ORDER — KETOROLAC TROMETHAMINE 15 MG/ML IJ SOLN
15.0000 mg | Freq: Four times a day (QID) | INTRAMUSCULAR | Status: AC | PRN
  Administered 2021-01-23 (×2): 15 mg via INTRAVENOUS
  Filled 2021-01-23 (×2): qty 1

## 2021-01-23 NOTE — Progress Notes (Signed)
°   01/23/21 0806  Peripheral IV 01/21/21 18 G Left;Posterior Hand  Placement Date/Time: 01/21/21 0747   Size (Gauge): 18 G  Orientation: Left;Posterior  Location: Hand  Site Prep: Chlorhexidine (preferred)  Insertion attempts: 1  Patient Tolerance: Tolerated well  Site Assessment Clean;Dry;Intact;Painful  Line Status Infusing  Dressing Type Transparent  Dressing Status Clean;Dry;Intact  Interventions Other (Comment) (Offered to restart yet pt refused/icepack applied to site-IV site WNL except tender...no redness or swelling note)   Pt reported pain decrease. IV site WNL w/no redness or swelling noted. Dr Freida Busman aware on rounds. Will continue to reassess.

## 2021-01-23 NOTE — Progress Notes (Signed)
Mobility Specialist - Progress Note    01/23/21 1213  Oxygen Therapy  O2 Device Nasal Cannula  O2 Flow Rate (L/min) 2 L/min  Mobility  Activity Ambulated in hall  Level of Assistance Contact guard assist, steadying assist  Assistive Device Other (Comment) (IV Pole)  Distance Ambulated (ft) 160 ft  Mobility Ambulated with assistance in hallway  Mobility Response Tolerated well  Mobility performed by Mobility specialist  $Mobility charge 1 Mobility   Pt agreeable to mobilize upon entry. Required hand held A to sit and stand at EOB. Pt c/o of 7/10 pain and tightness in abdomen, but was still willing to ambulate. Pt pushed IV pole during 160 ft ambulation. No reports of dizziness or SOB. Returned to room after session and left sitting up in bed with call bell at side and NGT reconnected by NT.  Arliss Journey Mobility Specialist Acute Rehabilitation Services Phone: [phone] 01/23/21, 12:17 PM

## 2021-01-23 NOTE — Plan of Care (Signed)
  Problem: Coping: Goal: Level of anxiety will decrease Outcome: Progressing   Problem: Pain Managment: Goal: General experience of comfort will improve Outcome: Progressing   

## 2021-01-23 NOTE — Progress Notes (Signed)
2 Days Post-Op  Subjective: Having some itching. No nausea or vomiting. Patient reports she is belching but not passing flatus. Ambulated yesterday.   Objective: Vital signs in last 24 hours: Temp:  [97.7 F (36.5 C)-99.5 F (37.5 C)] 99.5 F (37.5 C) (01/06 0458) Pulse Rate:  [80-87] 82 (01/06 0458) Resp:  [14-20] 16 (01/06 0458) BP: (107-117)/(64-69) 117/64 (01/06 0458) SpO2:  [98 %-100 %] 100 % (01/06 0458) Last BM Date: 01/20/21  Intake/Output from previous day: 01/05 0701 - 01/06 0700 In: 2118.6 [I.V.:1954; IV Piggyback:164.6] Out: 214 [Urine:4; Emesis/NG output:150; Drains:60] Intake/Output this shift: No intake/output data recorded.  PE: General: resting comfortably, NAD Neuro: alert and oriented, no focal deficits Resp: normal work of breathing on room air Abdomen: soft, nondistended, nontender to palpation. Midline incision clean and dry with no erythema, induration or drainage. Port sites clean and dry. JP with serosanguinous drainage. Extremities: warm and well-perfused   Lab Results:  Recent Labs    01/22/21 0504 01/23/21 0436  WBC 14.0* 8.5  HGB 8.4* 7.4*  HCT 29.5* 26.2*  PLT 306 290   BMET Recent Labs    01/22/21 0504 01/23/21 0436  NA 137 136  K 4.4 3.6  CL 103 101  CO2 28 26  GLUCOSE 116* 84  BUN 8 5*  CREATININE 0.59 0.64  CALCIUM 8.7* 8.4*   PT/INR No results for input(s): LABPROT, INR in the last 72 hours. CMP     Component Value Date/Time   NA 136 01/23/2021 0436   K 3.6 01/23/2021 0436   CL 101 01/23/2021 0436   CO2 26 01/23/2021 0436   GLUCOSE 84 01/23/2021 0436   BUN 5 (L) 01/23/2021 0436   CREATININE 0.64 01/23/2021 0436   CALCIUM 8.4 (L) 01/23/2021 0436   PROT 6.9 01/22/2021 0504   ALBUMIN 3.2 (L) 01/22/2021 0504   AST 43 (H) 01/22/2021 0504   ALT 78 (H) 01/22/2021 0504   ALKPHOS 156 (H) 01/22/2021 0504   BILITOT 0.5 01/22/2021 0504   GFRNONAA >60 01/23/2021 0436   Lipase     Component Value Date/Time    LIPASE 253 (H) 01/21/2021 0740       Studies/Results: DG Abd 1 View  Result Date: 01/21/2021 CLINICAL DATA:  Broken surgical instrument. EXAM: ABDOMEN - 1 VIEW COMPARISON:  None. FINDINGS: The bowel gas pattern is normal. Status post cholecystectomy. Surgical drain is seen in right upper quadrant. Nasogastric tube is seen in stomach. No other radiopaque foreign body is noted. IMPRESSION: Surgical drain seen in right upper quadrant. Nasogastric tube tip seen in stomach. No other radiopaque foreign body is noted. These results were called by telephone at the time of interpretation on 01/21/2021 at 11:55 am to provider [facility] Jadakiss Barish in OR 4, who verbally acknowledged these results. Electronically Signed   By: Lupita Raider M.D.   On: 01/21/2021 11:55   DG Cholangiogram Operative  Result Date: 01/21/2021 CLINICAL DATA:  Intraoperative cholangiogram during laparoscopic cholecystectomy. EXAM: INTRAOPERATIVE CHOLANGIOGRAM FLUOROSCOPY TIME:  10 seconds COMPARISON:  Right upper quadrant abdominal ultrasound-12/18/2020 FINDINGS: Intraoperative cholangiographic images of the right upper abdominal quadrant during laparoscopic cholecystectomy are provided for review. Surgical clips overlie the expected location of the gallbladder fossa. Contrast injection demonstrates selective cannulation of the central aspect of the cystic duct. There is passage of contrast through the central aspect of the cystic duct with filling of a mildly dilated common bile duct. There is passage of contrast though the CBD and into the descending portion  of the duodenum. There is minimal reflux of injected contrast into the common hepatic duct and central aspect of the non dilated intrahepatic biliary system. There are no discrete filling defects within the opacified portions of the biliary system to suggest the presence of choledocholithiasis. IMPRESSION: No evidence of choledocholithiasis. Electronically Signed   By: Simonne Come M.D.   On:  01/21/2021 11:03   DG C-Arm 1-60 Min-No Report  Result Date: 01/21/2021 Fluoroscopy was utilized by the requesting physician.  No radiographic interpretation.      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 - Remain NPO with NG decompression - Upper GI with gastrografin on POD3 (tomorrow). If no evidence of leak or obstruction, will remove NG after upper GI. - Continue PCA and toradol for pain. PRN benadryl for itching, likely from dilaudid. - Ambulate TID - VTE: lovenox, SCDs     LOS: 2 days    Sophronia Simas, MD [facility] Surgery General, Hepatobiliary and Pancreatic Surgery 01/23/21 9:26 AM

## 2021-01-24 ENCOUNTER — Inpatient Hospital Stay (HOSPITAL_COMMUNITY): Payer: No Typology Code available for payment source

## 2021-01-24 MED ORDER — SIMETHICONE 80 MG PO CHEW
80.0000 mg | CHEWABLE_TABLET | Freq: Four times a day (QID) | ORAL | Status: DC | PRN
  Administered 2021-01-24: 80 mg via ORAL
  Filled 2021-01-24: qty 1

## 2021-01-24 MED ORDER — IOHEXOL 300 MG/ML  SOLN
100.0000 mL | Freq: Once | INTRAMUSCULAR | Status: AC | PRN
  Administered 2021-01-24: 100 mL

## 2021-01-24 NOTE — Plan of Care (Signed)
  Problem: Clinical Measurements: Goal: Ability to maintain clinical measurements within normal limits will improve Outcome: Progressing Goal: Postoperative complications will be avoided or minimized Outcome: Progressing   

## 2021-01-24 NOTE — Progress Notes (Signed)
3 Days Post-Op  Subjective: Ambulating.  Pain is controlled.  Ready for NGT to be removed.    Objective: Vital signs in last 24 hours: Temp:  [98.1 F (36.7 C)-99.2 F (37.3 C)] 98.7 F (37.1 C) (01/07 0955) Pulse Rate:  [74-92] 74 (01/07 0955) Resp:  [16-20] 18 (01/07 0955) BP: (123-136)/(69-86) 123/72 (01/07 0955) SpO2:  [100 %] 100 % (01/07 0955) FiO2 (%):  [0 %-100 %] 100 % (01/07 0819) Last BM Date: 01/20/21  Intake/Output from previous day: 01/06 0701 - 01/07 0700 In: 1398.1 [I.V.:1229.8; IV Piggyback:168.2] Out: 965 [Urine:600; Emesis/NG output:300; Drains:65] Intake/Output this shift: Total I/O In: -  Out: 850 [Urine:400; Emesis/NG output:450]  PE: Abdomen: soft, nondistended, nontender to palpation. Midline incision clean and dry with no erythema, induration or drainage. Port sites clean and dry. JP with serosanguinous drainage.  NGT with low output.    Lab Results:  Recent Labs    01/22/21 0504 01/23/21 0436  WBC 14.0* 8.5  HGB 8.4* 7.4*  HCT 29.5* 26.2*  PLT 306 290   BMET Recent Labs    01/22/21 0504 01/23/21 0436  NA 137 136  K 4.4 3.6  CL 103 101  CO2 28 26  GLUCOSE 116* 84  BUN 8 5*  CREATININE 0.59 0.64  CALCIUM 8.7* 8.4*   PT/INR No results for input(s): LABPROT, INR in the last 72 hours. CMP     Component Value Date/Time   NA 136 01/23/2021 0436   K 3.6 01/23/2021 0436   CL 101 01/23/2021 0436   CO2 26 01/23/2021 0436   GLUCOSE 84 01/23/2021 0436   BUN 5 (L) 01/23/2021 0436   CREATININE 0.64 01/23/2021 0436   CALCIUM 8.4 (L) 01/23/2021 0436   PROT 6.9 01/22/2021 0504   ALBUMIN 3.2 (L) 01/22/2021 0504   AST 43 (H) 01/22/2021 0504   ALT 78 (H) 01/22/2021 0504   ALKPHOS 156 (H) 01/22/2021 0504   BILITOT 0.5 01/22/2021 0504   GFRNONAA >60 01/23/2021 0436   Lipase     Component Value Date/Time   LIPASE 253 (H) 01/21/2021 0740       Studies/Results: DG UGI W SINGLE CM (SOL OR THIN BA)  Result Date:  01/24/2021 CLINICAL DATA:  cholecystoduodenal fistula, S/P repair, R/O post-op duodenal leak EXAM: WATER SOLUBLE UPPER GI SERIES TECHNIQUE: Single-column upper GI series was performed using water soluble contrast. CONTRAST:  100 cc of Omnipaque 300 COMPARISON:  None. FLUOROSCOPY TIME:  Fluoroscopy Time:  4 minutes 36 seconds Radiation Exposure Index (if provided by the fluoroscopic device): 82.7 mGy Number of Acquired Spot Images: 0 FINDINGS: On the scout radiograph surgical clips from cholecystectomy identified. A surgical drainage catheter overlies the right upper quadrant of the abdomen. There is a nasogastric tube tip and side port within the stomach. The nasogastric tube was accessed and 100 cc of Omnipaque 300 were injected by the technologist. Expected contrast opacification of the stomach was observed. Patient was then placed in the right lateral orientation and emptying of the stomach into the duodenum was observed. The patient was moved into the LPO orientation resulting in opacification of the entire duodenal C loop. No contrast extravasation identified to suggest leak. IMPRESSION: 1. No contrast extravasation identified from the stomach or duodenum to suggest postoperative leak. Electronically Signed   By: Signa Kell M.D.   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 - UGI negative for leak -DC NGT and give CLD - Continue PCA and toradol for pain. PRN benadryl for itching, likely from dilaudid.  Will plan to start weaning PCA as her oral intake increases - Ambulate TID -cont JP drain and monitor output -Fen - CLD/IVFs - VTE: lovenox, SCDs  -ID - none currently  LOS: 3 days    Letha Cape, PA-C [facility] 01/24/21 10:49 AM

## 2021-01-24 NOTE — Progress Notes (Signed)
Physical Therapy Treatment Patient Details Name: Kelli Russo MRN: [id number] DOB: 03-21-1980 Today's Date: 01/24/2021   History of Present Illness Patient is 40 y.o. female s/p lap cholecystectomy with cholangiogram and repair of duodenal fistula on 01/21/21.PMH significant for anemia, anxiety, depression, GERD.    PT Comments    Pt progressing toward goals. Increasing gait/activity tolerance. Pt has flight of stairs to enter her home, continue PT POC  Recommendations for follow up therapy are one component of a multi-disciplinary discharge planning process, led by the attending physician.  Recommendations may be updated based on patient status, additional functional criteria and insurance authorization.  Follow Up Recommendations  No PT follow up     Assistance Recommended at Discharge Set up Supervision/Assistance  Patient can return home with the following     Equipment Recommendations  None recommended by PT    Recommendations for Other Services       Precautions / Restrictions Precautions Precautions: Fall Precaution Comments: JP drain Rt abd Restrictions Weight Bearing Restrictions: No     Mobility  Bed Mobility Overal bed mobility: Needs Assistance Bed Mobility: Sidelying to Sit;Rolling;Sit to Sidelying Rolling: Supervision Sidelying to sit: Supervision;HOB elevated     Sit to sidelying: Supervision;HOB elevated (HOB ~ 40 degrees) General bed mobility comments: supervision, cues for sequencing log roll technique.    Transfers Overall transfer level: Needs assistance Equipment used: Rolling walker (2 wheels) Transfers: Sit to/from Stand Sit to Stand: Min guard           General transfer comment: guarding for safety, cues to use bil UE for power up. no assist needed.    Ambulation/Gait Ambulation/Gait assistance: Min guard;Min assist Gait Distance (Feet): 240 Feet Assistive device: Rolling walker (2 wheels);None Gait Pattern/deviations: Step-through  pattern;WFL(Within Functional Limits);Trunk flexed Gait velocity: decr     General Gait Details: trunk flexed  due to abd pain0-cues for extension as tol,  and no overt LOB noted. attempted without device and then provided RW to allow decr pain and incr trunk extension. intermittently and min guard for safety   Stairs             Wheelchair Mobility    Modified Rankin (Stroke Patients Only)       Balance                                            Cognition Arousal/Alertness: Awake/alert Behavior During Therapy: WFL for tasks assessed/performed Overall Cognitive Status: Within Functional Limits for tasks assessed                                          Exercises      General Comments        Pertinent Vitals/Pain Pain Assessment: Faces Faces Pain Scale: Hurts even more Pain Location: abdomen Pain Descriptors / Indicators: Discomfort;Guarding Pain Intervention(s): Limited activity within patient's tolerance;Monitored during session;Repositioned (has PCA)    Home Living                          Prior Function            PT Goals (current goals can now be found in the care plan section) Acute Rehab PT Goals Patient Stated Goal: stop hurting and get  back home to dog and family PT Goal Formulation: With patient Time For Goal Achievement: 02/05/21 Potential to Achieve Goals: Good Progress towards PT goals: Progressing toward goals    Frequency    Min 3X/week      PT Plan Current plan remains appropriate    Co-evaluation              AM-PAC PT "6 Clicks" Mobility   Outcome Measure  Help needed turning from your back to your side while in a flat bed without using bedrails?: A Little Help needed moving from lying on your back to sitting on the side of a flat bed without using bedrails?: A Little Help needed moving to and from a bed to a chair (including a wheelchair)?: A Little Help needed  standing up from a chair using your arms (e.g., wheelchair or bedside chair)?: A Little Help needed to walk in hospital room?: A Little Help needed climbing 3-5 steps with a railing? : A Little 6 Click Score: 18    End of Session Equipment Utilized During Treatment: Gait belt Activity Tolerance: Patient tolerated treatment well Patient left: in bed;with call bell/phone within reach;with family/visitor present (alarm off on arrival) Nurse Communication: Mobility status PT Visit Diagnosis: Muscle weakness (generalized) (M62.81);Difficulty in walking, not elsewhere classified (R26.2)     Time: [id number] PT Time Calculation (min) (ACUTE ONLY): 20 min  Charges:  $Gait Training: 8-22 mins                     Baxter Flattery, PT  Acute Rehab Dept ([place]) [phone] Pager [phone]  01/24/2021    [facility] 01/24/2021, 11:30 AM

## 2021-01-25 LAB — CBC
HCT: 26.5 % — ABNORMAL LOW (ref 36.0–46.0)
Hemoglobin: 7.6 g/dL — ABNORMAL LOW (ref 12.0–15.0)
MCH: 20.9 pg — ABNORMAL LOW (ref 26.0–34.0)
MCHC: 28.7 g/dL — ABNORMAL LOW (ref 30.0–36.0)
MCV: 72.8 fL — ABNORMAL LOW (ref 80.0–100.0)
Platelets: 315 10*3/uL (ref 150–400)
RBC: 3.64 MIL/uL — ABNORMAL LOW (ref 3.87–5.11)
RDW: 22.3 % — ABNORMAL HIGH (ref 11.5–15.5)
WBC: 8 10*3/uL (ref 4.0–10.5)
nRBC: 0 % (ref 0.0–0.2)

## 2021-01-25 MED ORDER — POLYETHYLENE GLYCOL 3350 17 G PO PACK
17.0000 g | PACK | Freq: Every day | ORAL | Status: DC
  Administered 2021-01-25 – 2021-01-26 (×2): 17 g via ORAL
  Filled 2021-01-25 (×3): qty 1

## 2021-01-25 MED ORDER — DOCUSATE SODIUM 100 MG PO CAPS
100.0000 mg | ORAL_CAPSULE | Freq: Two times a day (BID) | ORAL | Status: DC
  Administered 2021-01-25 – 2021-01-26 (×3): 100 mg via ORAL
  Filled 2021-01-25 (×3): qty 1

## 2021-01-25 MED ORDER — KETOROLAC TROMETHAMINE 30 MG/ML IJ SOLN
30.0000 mg | Freq: Four times a day (QID) | INTRAMUSCULAR | Status: DC
  Administered 2021-01-25 – 2021-01-26 (×5): 30 mg via INTRAVENOUS
  Filled 2021-01-25 (×5): qty 1

## 2021-01-25 MED ORDER — HYDROMORPHONE HCL 1 MG/ML IJ SOLN: 1.0000 mg | INTRAMUSCULAR | Status: DC | PRN

## 2021-01-25 MED ORDER — OXYCODONE HCL 5 MG PO TABS: 5.0000 mg | ORAL_TABLET | ORAL | Status: DC | PRN

## 2021-01-25 NOTE — Progress Notes (Signed)
4 Days Post-Op   Subjective/Chief Complaint: UGI without leak NG removed - tolerating clears No BM since surgery Drain - minimal serous output Still using PCA  Objective: Vital signs in last 24 hours: Temp:  [97.8 F (36.6 C)-99.4 F (37.4 C)] 98.4 F (36.9 C) (01/08 0519) Pulse Rate:  [79-87] 86 (01/08 0519) Resp:  [18-24] 20 (01/08 0820) BP: (94-136)/(64-89) 136/89 (01/08 0519) SpO2:  [99 %-100 %] 100 % (01/08 0820) FiO2 (%):  [100 %] 100 % (01/08 0820) Last BM Date: 01/20/21  Intake/Output from previous day: 01/07 0701 - 01/08 0700 In: 1857.1 [P.O.:840; I.V.:900; IV Piggyback:117.1] Out: 1752 [Urine:1300; Emesis/NG output:450; Drains:2] Intake/Output this shift: Total I/O In: 50 [IV Piggyback:50] Out: -   Abdomen: soft, nondistended, nontender to palpation.  Midline incision clean and dry with no erythema, induration or drainage.  Port sites clean and dry.  JP with serous drainage.    Lab Results:  Recent Labs    01/23/21 0436 01/25/21 0446  WBC 8.5 8.0  HGB 7.4* 7.6*  HCT 26.2* 26.5*  PLT 290 315   BMET Recent Labs    01/23/21 0436  NA 136  K 3.6  CL 101  CO2 26  GLUCOSE 84  BUN 5*  CREATININE 0.64  CALCIUM 8.4*   PT/INR No results for input(s): LABPROT, INR in the last 72 hours. ABG No results for input(s): PHART, HCO3 in the last 72 hours.  Invalid input(s): PCO2, PO2  Studies/Results: DG UGI W SINGLE CM (SOL OR THIN BA)  Result Date: 01/24/2021 CLINICAL DATA:  cholecystoduodenal fistula, S/P repair, R/O post-op duodenal leak EXAM: WATER SOLUBLE UPPER GI SERIES TECHNIQUE: Single-column upper GI series was performed using water soluble contrast. CONTRAST:  100 cc of Omnipaque 300 COMPARISON:  None. FLUOROSCOPY TIME:  Fluoroscopy Time:  4 minutes 36 seconds Radiation Exposure Index (if provided by the fluoroscopic device): 82.7 mGy Number of Acquired Spot Images: 0 FINDINGS: On the scout radiograph surgical clips from cholecystectomy identified.  A surgical drainage catheter overlies the right upper quadrant of the abdomen. There is a nasogastric tube tip and side port within the stomach. The nasogastric tube was accessed and 100 cc of Omnipaque 300 were injected by the technologist. Expected contrast opacification of the stomach was observed. Patient was then placed in the right lateral orientation and emptying of the stomach into the duodenum was observed. The patient was moved into the LPO orientation resulting in opacification of the entire duodenal C loop. No contrast extravasation identified to suggest leak. IMPRESSION: 1. No contrast extravasation identified from the stomach or duodenum to suggest postoperative leak. Electronically Signed   By: Signa Kell M.D.   On: 01/24/2021 10:24    Anti-infectives: Anti-infectives (From admission, onward)    Start     Dose/Rate Route Frequency Ordered Stop   01/21/21 1100  metroNIDAZOLE (FLAGYL) IVPB 500 mg        500 mg 100 mL/hr over 60 Minutes Intravenous  Once 01/21/21 1057 01/21/21 1129   01/21/21 1057  metroNIDAZOLE (FLAGYL) 500 MG/100ML IVPB       Note to Pharmacy: Sharyn Creamer W: cabinet override      01/21/21 1057 01/21/21 2314   01/21/21 0730  ceFAZolin (ANCEF) IVPB 2g/100 mL premix        2 g 200 mL/hr over 30 Minutes Intravenous On call to O.R. 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 - UGI negative for leak - Advance to full liquids - D/C PCA - Oxycodone IR/ Toradol scheduled q6/ Dilaudid for breakthrough pain - Ambulate TID - abdominal binder -cont JP drain and monitor output - will d/c when patient is on regular diet -Fen - FLD/IVFs - VTE: lovenox, SCDs  -ID - none currently  LOS: 4 days    Wynona Luna 01/25/2021

## 2021-01-26 MED ORDER — DOCUSATE SODIUM 100 MG PO CAPS: 100.0000 mg | ORAL_CAPSULE | Freq: Every day | ORAL | Status: DC | PRN

## 2021-01-26 MED ORDER — POLYETHYLENE GLYCOL 3350 17 G PO PACK: 17.0000 g | PACK | Freq: Every day | ORAL | Status: DC | PRN

## 2021-01-26 MED ORDER — OXYCODONE HCL 5 MG PO TABS: 5.0000 mg | ORAL_TABLET | Freq: Four times a day (QID) | ORAL | 0 refills | Status: DC | PRN

## 2021-01-26 MED ORDER — METHOCARBAMOL 500 MG PO TABS: 500.0000 mg | ORAL_TABLET | Freq: Four times a day (QID) | ORAL | 0 refills | Status: DC | PRN

## 2021-01-26 NOTE — Progress Notes (Signed)
5 Days Post-Op   Subjective/Chief Complaint: Tolerated full liquid diet though not eating much secondary to taste preference. Pain is well controlled off PCA. Still no BM since surgery. Passing flatus  Objective: Vital signs in last 24 hours: Temp:  [98.5 F (36.9 C)-98.6 F (37 C)] 98.5 F (36.9 C) (01/09 0505) Pulse Rate:  [74-79] 77 (01/09 0505) Resp:  [14-17] 14 (01/09 0505) BP: (119-123)/(70-86) 123/70 (01/09 0505) SpO2:  [100 %] 100 % (01/09 0505) Last BM Date: 01/20/21  Intake/Output from previous day: 01/08 0701 - 01/09 0700 In: 1987.6 [P.O.:1000; I.V.:832.4; IV Piggyback:155.2] Out: 1005 [Urine:1000; Drains:5] Intake/Output this shift: No intake/output data recorded.  Abdomen: soft, nondistended, nontender to palpation.  Midline incision clean and dry with no erythema, induration or drainage. Glue intact Port sites clean and dry.  JP with scant serous drainage.    Lab Results:  Recent Labs    01/25/21 0446  WBC 8.0  HGB 7.6*  HCT 26.5*  PLT 315    BMET No results for input(s): NA, K, CL, CO2, GLUCOSE, BUN, CREATININE, CALCIUM in the last 72 hours.  PT/INR No results for input(s): LABPROT, INR in the last 72 hours. ABG No results for input(s): PHART, HCO3 in the last 72 hours.  Invalid input(s): PCO2, PO2  Studies/Results: No results found.  Anti-infectives: Anti-infectives (From admission, onward)    Start     Dose/Rate Route Frequency Ordered Stop   01/21/21 1100  metroNIDAZOLE (FLAGYL) IVPB 500 mg        500 mg 100 mL/hr over 60 Minutes Intravenous  Once 01/21/21 1057 01/21/21 1129   01/21/21 1057  metroNIDAZOLE (FLAGYL) 500 MG/100ML IVPB       Note to Pharmacy: Sharyn Creamer W: cabinet override      01/21/21 1057 01/21/21 2314   01/21/21 0730  ceFAZolin (ANCEF) IVPB 2g/100 mL premix        2 g 200 mL/hr over 30 Minutes Intravenous On call to O.R. 01/21/21 1610 01/21/21 0943       Assessment/Plan:  41 yo female with cholecystoduodenal  fistula, POD5 s/p laparoscopic converted to open cholecystectomy, cholangiogram and primary repair of duodenal fistula. - UGI negative for leak - tolerating FLD. Advance to soft - D/C PCA 1/8- Oxycodone IR/ Toradol scheduled q6/ Dilaudid for breakthrough pain. Pain is well controlled and not using PO opioids - Ambulate TID - abdominal binder -cont JP drain and monitor output, has been minimal- will d/c when patient is on regular diet. Possibly tomorrow  -Fen - soft diet, SLIV - VTE: lovenox, SCDs  -ID - none currently   LOS: 5 days    Eric Form 01/26/2021

## 2021-01-26 NOTE — Discharge Summary (Signed)
[facility] Discharge Summary   Patient ID: Kelli Russo MRN: [id number] DOB/AGE: 1980-08-18 41 y.o.  Admit date: 01/21/2021 Discharge date: 01/26/2021  Admitting Diagnosis: Symptomatic cholelithiasis  Discharge Diagnosis Symptomatic cholelithiasis Chronic cholecystitis  Cholecystoduodenal fistula  Consultants None   Imaging: No results found.  Procedures Dr. Sophronia Simas (01/21/21) - Laparoscopic converted to open cholecystectomy, IOC, primary repair of duodenal fistula with placement of falciform ligament flap  Hospital Course:  Patient is a 41 year old female who presented to [facility] with symptomatic cholelithiasis. Patient was admitted and underwent procedure listed above.  Intraoperative findings of cholecystoduodenal fistula and this was repaired as listed above. Tolerated procedure well and was transferred to the floor. UGI on POD3 was without leak, NGT was removed. Diet was advanced as tolerated.  On POD5, the patient was voiding well, tolerating diet, ambulating well, pain well controlled, vital signs stable, incisions c/d/i and felt stable for discharge home. Drain was removed prior to discharge Patient will follow up in our office in 2 weeks and knows to call with questions or concerns.    I or a member of my team have reviewed this patient in the Controlled Substance Database.   Allergies as of 01/26/2021   No Known Allergies      Medication List     STOP taking these medications    dicyclomine 20 MG tablet Commonly known as: BENTYL       TAKE these medications    docusate sodium 100 MG capsule Commonly known as: COLACE Take 1 capsule (100 mg total) by mouth daily as needed for mild constipation.   ibuprofen 200 MG tablet Commonly known as: ADVIL Take 400 mg by mouth every 8 (eight) hours as needed for moderate pain.   methocarbamol 500 MG tablet Commonly known as: Robaxin Take 1 tablet (500 mg total) by mouth every 6 (six) hours as needed for  muscle spasms.   oxyCODONE 5 MG immediate release tablet Commonly known as: Oxy IR/ROXICODONE Take 1 tablet (5 mg total) by mouth every 6 (six) hours as needed for moderate pain or severe pain.   pantoprazole 20 MG tablet Commonly known as: PROTONIX Take 1 tablet (20 mg total) by mouth 2 (two) times daily.   polyethylene glycol 17 g packet Commonly known as: MIRALAX / GLYCOLAX Take 17 g by mouth daily as needed for mild constipation.          Follow-up Information     [facility]. Go on 02/04/2021.   Specialty: General Surgery Why: For staple removal. The office should call with a time. Contact information: [address] [place] [address] [phone]         Fritzi Mandes, MD. Schedule an appointment as soon as possible for a visit in 2 week(s).   Specialty: General Surgery Why: Follow up 1-2 weeks after staple removal with Dr. Maureen Chatters information: [address] [place] [address] [phone]                 Signed: Juliet Rude , [facility] 01/26/2021, 3:57 PM Please see Amion for pager number during day hours 7:00am-4:30pm

## 2021-01-26 NOTE — Discharge Instructions (Signed)
[facility] °[phone] ° °OPEN ABDOMINAL SURGERY: POST OP INSTRUCTIONS ° °Always review your discharge instruction sheet given to you by the facility where your surgery was performed. ° °IF YOU HAVE DISABILITY OR FAMILY LEAVE FORMS, YOU MUST BRING THEM TO THE OFFICE FOR PROCESSING.  PLEASE DO NOT GIVE THEM TO YOUR DOCTOR. ° °A prescription for pain medication may be given to you upon discharge.  Take your pain medication as prescribed, if needed.  If narcotic pain medicine is not needed, then you may take acetaminophen (Tylenol) or ibuprofen (Advil) as needed. °Take your usually prescribed medications unless otherwise directed. °If you need a refill on your pain medication, please contact your pharmacy. They will contact our office to request authorization.  Prescriptions will not be filled after 5pm or on week-ends. °You should follow a light diet the first few days after arrival home, such as soup and crackers, pudding, etc.unless your doctor has advised otherwise. A high-fiber, low fat diet can be resumed as tolerated.   Be sure to include lots of fluids daily. Most patients will experience some swelling and bruising on the chest and neck area.  Ice packs will help.  Swelling and bruising can take several days to resolve °Most patients will experience some swelling and bruising in the area of the incision. Ice pack will help. Swelling and bruising can take several days to resolve..  °It is common to experience some constipation if taking pain medication after surgery.  Increasing fluid intake and taking a stool softener will usually help or prevent this problem from occurring.  A mild laxative (Milk of Magnesia or Miralax) should be taken according to package directions if there are no bowel movements after 48 hours. ° You may have steri-strips (small skin tapes) in place directly over the incision.  These strips should be left on the skin for 7-10 days.  If your surgeon used skin  glue on the incision, you may shower in 24 hours.  The glue will flake off over the next 2-3 weeks.  Any sutures or staples will be removed at the office during your follow-up visit. You may find that a light gauze bandage over your incision may keep your staples from being rubbed or pulled. You may shower and replace the bandage daily. °ACTIVITIES:  You may resume regular (light) daily activities beginning the next day--such as daily self-care, walking, climbing stairs--gradually increasing activities as tolerated.  You may have sexual intercourse when it is comfortable.  Refrain from any heavy lifting or straining until approved by your doctor. °You may drive when you no longer are taking prescription pain medication, you can comfortably wear a seatbelt, and you can safely maneuver your car and apply brakes ° °You should see your doctor in the office for a follow-up appointment approximately two weeks after your surgery.  Make sure that you call for this appointment within a day or two after you arrive home to insure a convenient appointment time. ° °WHEN TO CALL YOUR DOCTOR: °Fever over 101.0 °Inability to urinate °Nausea and/or vomiting °Extreme swelling or bruising °Continued bleeding from incision. °Increased pain, redness, or drainage from the incision. °Difficulty swallowing or breathing °Muscle cramping or spasms. °Numbness or tingling in hands or feet or around lips. ° °The clinic staff is available to answer your questions during regular business hours.  Please don’t hesitate to call and ask to speak to one of the nurses if you have concerns. ° °For   further questions, please visit [email or web address]  °

## 2021-01-26 NOTE — Progress Notes (Signed)
Physical Therapy Treatment and Discharge  Patient Details Name: Kelli Russo MRN: [id number] DOB: 1980-09-20 Today's Date: 01/26/2021   History of Present Illness Patient is 41 y.o. female s/p lap cholecystectomy with cholangiogram and repair of duodenal fistula on 01/21/21.PMH significant for anemia, anxiety, depression, GERD.    PT Comments    Pt ambulated in hallway and practiced stairs.  Pt mobilizing well and has met acute PT goals.  Pt hopeful for d/c home tomorrow.  PT to sign off at this time.   Recommendations for follow up therapy are one component of a multi-disciplinary discharge planning process, led by the attending physician.  Recommendations may be updated based on patient status, additional functional criteria and insurance authorization.  Follow Up Recommendations  No PT follow up     Assistance Recommended at Discharge Set up Supervision/Assistance  Patient can return home with the following     Equipment Recommendations  None recommended by PT    Recommendations for Other Services       Precautions / Restrictions Precautions Precautions: Fall Precaution Comments: JP drain Rt abd     Mobility  Bed Mobility               General bed mobility comments: pt up on arrival    Transfers Overall transfer level: Modified independent                      Ambulation/Gait Ambulation/Gait assistance: Supervision;Modified independent (Device/Increase time) Gait Distance (Feet): 400 Feet Assistive device: Rolling walker (2 wheels) Gait Pattern/deviations: Trunk flexed       General Gait Details: trunk flexed due to comfort, encouraged upright posture as tolerated, no LOB or unsteadiness observed   Stairs Stairs: Yes Stairs assistance: Supervision Stair Management: Step to pattern;Forwards;One rail Right Number of Stairs: 5 General stair comments: performed 5 steps twice, no difficulties, pt feels she will be able to perform upon  d/c.   Wheelchair Mobility    Modified Rankin (Stroke Patients Only)       Balance                                            Cognition Arousal/Alertness: Awake/alert Behavior During Therapy: WFL for tasks assessed/performed Overall Cognitive Status: Within Functional Limits for tasks assessed                                          Exercises      General Comments        Pertinent Vitals/Pain Pain Assessment: No/denies pain Pain Intervention(s): Monitored during session    Home Living                          Prior Function            PT Goals (current goals can now be found in the care plan section) Progress towards PT goals: Goals met/education completed, patient discharged from PT    Frequency           PT Plan Current plan remains appropriate    Co-evaluation              AM-PAC PT "6 Clicks" Mobility   Outcome Measure  Help needed turning from your back  to your side while in a flat bed without using bedrails?: A Little Help needed moving from lying on your back to sitting on the side of a flat bed without using bedrails?: A Little Help needed moving to and from a bed to a chair (including a wheelchair)?: A Little Help needed standing up from a chair using your arms (e.g., wheelchair or bedside chair)?: A Little Help needed to walk in hospital room?: A Little Help needed climbing 3-5 steps with a railing? : A Little 6 Click Score: 18    End of Session   Activity Tolerance: Patient tolerated treatment well Patient left: with call bell/phone within reach;with nursing/sitter in room (up in room with nurse tech)   PT Visit Diagnosis: Difficulty in walking, not elsewhere classified (R26.2)     Time: 4090-5025 PT Time Calculation (min) (ACUTE ONLY): 9 min  Charges:  $Gait Training: 8-22 mins                     Arlyce Dice, DPT Acute Rehabilitation Services Pager: [phone] Office:  [place] 01/26/2021, 1:59 PM

## 2021-04-08 ENCOUNTER — Ambulatory Visit (HOSPITAL_COMMUNITY)
Admission: EM | Admit: 2021-04-08 | Discharge: 2021-04-08 | Disposition: A | Discharge status: Home / Self Care | Payer: No Typology Code available for payment source | Attending: Family Medicine | Admitting: Family Medicine

## 2021-04-08 ENCOUNTER — Encounter (HOSPITAL_COMMUNITY): Payer: Self-pay | Admitting: *Deleted

## 2021-04-08 DIAGNOSIS — J069 Acute upper respiratory infection, unspecified: Secondary | ICD-10-CM | POA: Diagnosis not present

## 2021-04-08 MED ORDER — PROMETHAZINE-DM 6.25-15 MG/5ML PO SYRP
5.0000 mL | ORAL_SOLUTION | Freq: Four times a day (QID) | ORAL | 0 refills | Status: DC | PRN
Start: 2021-04-08 — End: 2021-05-25

## 2021-04-08 NOTE — ED Triage Notes (Addendum)
C/O severe cough onset 4 days ago. Reports coughing fits resulting in vomiting, fatigue, poor appetite, sweating, HA. Over past 2 days has had diarrhea. ?

## 2021-04-11 NOTE — ED Provider Notes (Signed)
?[facility] ? ? ?[id number] ?04/08/21 Arrival Time: 1928 ? ?ASSESSMENT & PLAN: ? ?1. Viral URI with cough   ? ?Discussed typical duration of viral illnesses. ?OTC symptom care as needed. ? ?Discharge Medication List as of 04/08/2021  8:06 PM  ?  ? ?START taking these medications  ? Details  ?promethazine-dextromethorphan (PROMETHAZINE-DM) 6.25-15 MG/5ML syrup Take 5 mLs by mouth 4 (four) times daily as needed for cough., Starting Wed 04/08/2021, Normal  ?  ?  ? ? ? Follow-up Information   ? ? [facility] at [facility].   ?Specialty: Urgent Care ?Why: If worsening or failing to improve as anticipated. ?Contact information: ?[address] ?[place] [address] ?[phone] ? ?  ?  ? ?  ?  ? ?  ? ? ?Reviewed expectations re: course of current medical issues. Questions answered. ?Outlined signs and symptoms indicating need for more acute intervention. ?Understanding verbalized. ?After Visit Summary given. ? ? ?SUBJECTIVE: ?History from: Patient. ?Kelli Russo is a 41 y.o. female. C/O severe cough onset 4 days ago. Reports coughing fits resulting in vomiting, fatigue, poor appetite, sweating, HA. Over past 2 days has had diarrhea. Non-bloody ?Marland Kitchen Denies: fever. Normal PO intake without n/v/d. ? ?OBJECTIVE: ? ?Vitals:  ? 04/08/21 1947  ?BP: 120/87  ?Pulse: (!) 122  ?Resp: 18  ?Temp: 98.4 ?F (36.9 ?C)  ?TempSrc: Oral  ?SpO2: 100%  ?  ?Tachycardia noted. ? ?General appearance: alert; no distress ?Eyes: PERRLA; EOMI; conjunctiva normal ?HENT: [place]; AT; with nasal congestion ?Neck: supple  ?Lungs: speaks full sentences without difficulty; unlabored; coarse cough ?Extremities: no edema ?Skin: warm and dry ?Neurologic: normal gait ?Psychological: alert and cooperative; normal mood and affect ? ? ? ?No Known Allergies ? ?Past Medical History:  ?Diagnosis Date  ? Anemia   ? Anxiety   ? Depression   ? GERD (gastroesophageal reflux disease)   ? ?Social History  ? ?Socioeconomic History  ? Marital  status: Married  ?  Spouse name: Not on file  ? Number of children: Not on file  ? Years of education: Not on file  ? Highest education level: Not on file  ?Occupational History  ? Not on file  ?Tobacco Use  ? Smoking status: Some Days  ?  Types: Cigarettes  ? Smokeless tobacco: Never  ?Vaping Use  ? Vaping Use: Never used  ?Substance and Sexual Activity  ? Alcohol use: Not Currently  ? Drug use: Never  ? Sexual activity: Yes  ?  Birth control/protection: Condom  ?Other Topics Concern  ? Not on file  ?Social History Narrative  ? Not on file  ? ?Social Determinants of Health  ? ?Financial Resource Strain: Not on file  ?Food Insecurity: Not on file  ?Transportation Needs: Not on file  ?Physical Activity: Not on file  ?Stress: Not on file  ?Social Connections: Not on file  ?Intimate Partner Violence: Not on file  ? ?Family History  ?Problem Relation Age of Onset  ? Diabetes Mother   ? CAD Mother   ? Cancer Father   ? Diabetes Father   ? Colon cancer Father   ? Esophageal cancer Neg Hx   ? Rectal cancer Neg Hx   ? Stomach cancer Neg Hx   ? ?Past Surgical History:  ?Procedure Laterality Date  ? CHOLECYSTECTOMY N/A 01/21/2021  ? Procedure: LAPAROSCOPIC CHOLECYSTECTOMY WITH INTRAOPERATIVE CHOLANGIOGRAM CONVERTED TO OPEN AT 10:00AM;  Surgeon: Fritzi Mandes, MD;  Location: [facility];  Service: General;  Laterality: N/A;  ? WISDOM TOOTH EXTRACTION    ? ?  ?Mardella Layman, MD ?04/11/21 7564 ? ?

## 2021-05-25 ENCOUNTER — Encounter (HOSPITAL_COMMUNITY): Payer: Self-pay | Admitting: Emergency Medicine

## 2021-05-25 ENCOUNTER — Ambulatory Visit (HOSPITAL_COMMUNITY)
Admission: EM | Admit: 2021-05-25 | Discharge: 2021-05-25 | Disposition: A | Discharge status: Home / Self Care | Payer: No Typology Code available for payment source | Attending: Internal Medicine | Admitting: Internal Medicine

## 2021-05-25 DIAGNOSIS — K047 Periapical abscess without sinus: Secondary | ICD-10-CM | POA: Diagnosis not present

## 2021-05-25 MED ORDER — IBUPROFEN 600 MG PO TABS: 600.0000 mg | ORAL_TABLET | Freq: Four times a day (QID) | ORAL | 0 refills | Status: AC | PRN

## 2021-05-25 MED ORDER — CHLORHEXIDINE GLUCONATE 4 % EX LIQD: Freq: Every day | CUTANEOUS | 0 refills | Status: AC | PRN

## 2021-05-25 MED ORDER — AMOXICILLIN-POT CLAVULANATE 875-125 MG PO TABS: 1.0000 | ORAL_TABLET | Freq: Two times a day (BID) | ORAL | 0 refills | Status: DC

## 2021-05-25 NOTE — ED Provider Notes (Signed)
?[facility] ? ? ? ?CSN: [id number] ?Arrival date & time: 05/25/21  1827 ? ? ?  ? ?History   ?Chief Complaint ?Chief Complaint  ?Patient presents with  ? Dental Pain  ? ? ?HPI ?Kelli Russo is a 41 y.o. female with a history of poor dental hygiene comes to urgent care with 1 week history of right sided facial pain.  Patient has complained of dental pain on the right side for the past week.  She noticed swelling on the right side of the face yesterday.  She comes to urgent care to be reevaluated.  Patient endorses mild facial swelling with no fever or chills today.  She has dental cavities involving the premolars and the wisdom teeth.  Pain is currently 6 out of 10, throbbing, relieved by taking some Advil and associated with right-sided facial swelling..  ? ?HPI ? ?Past Medical History:  ?Diagnosis Date  ? Anemia   ? Anxiety   ? Depression   ? GERD (gastroesophageal reflux disease)   ? ? ?Patient Active Problem List  ? Diagnosis Date Noted  ? 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 ? ?Past Surgical History:  ?Procedure Laterality Date  ? CHOLECYSTECTOMY N/A 01/21/2021  ? Procedure: LAPAROSCOPIC CHOLECYSTECTOMY WITH INTRAOPERATIVE CHOLANGIOGRAM CONVERTED TO OPEN AT 10:00AM;  Surgeon: Fritzi Mandes, MD;  Location: [facility];  Service: General;  Laterality: N/A;  ? WISDOM TOOTH EXTRACTION    ? ? ?OB History   ?No obstetric history on file. ?  ? ? ? ?Home Medications   ? ?Prior to Admission medications   ?Medication Sig Start Date End Date Taking? Authorizing Provider  ?amoxicillin-clavulanate (AUGMENTIN) 875-125 MG tablet Take 1 tablet by mouth every 12 (twelve) hours. 05/25/21  Yes Larna Capelle, Britta Mccreedy, MD  ?chlorhexidine (HIBICLENS) 4 % external liquid Apply topically daily as needed. 05/25/21  Yes Kaitlinn Iversen, Britta Mccreedy, MD  ?ibuprofen (ADVIL) 600 MG tablet Take 1 tablet (600 mg total) by mouth every 6 (six) hours as needed. 05/25/21  Yes Kamden Reber, Britta Mccreedy, MD  ? ? ?Family History ?Family History  ?Problem Relation Age of  Onset  ? Diabetes Mother   ? CAD Mother   ? Cancer Father   ? Diabetes Father   ? Colon cancer Father   ? Esophageal cancer Neg Hx   ? Rectal cancer Neg Hx   ? Stomach cancer Neg Hx   ? ? ?Social History ?Social History  ? ?Tobacco Use  ? Smoking status: Some Days  ?  Types: Cigarettes  ? Smokeless tobacco: Never  ?Vaping Use  ? Vaping Use: Never used  ?Substance Use Topics  ? Alcohol use: Not Currently  ? Drug use: Never  ? ? ? ?Allergies   ?Patient has no known allergies. ? ? ?Review of Systems ?Review of Systems ?As per HPI ? ?Physical Exam ?Triage Vital Signs ?ED Triage Vitals  ?Enc Vitals Group  ?   BP 05/25/21 1915 116/66  ?   Pulse Rate 05/25/21 1915 67  ?   Resp 05/25/21 1915 14  ?   Temp 05/25/21 1915 98.9 ?F (37.2 ?C)  ?   Temp Source 05/25/21 1915 Oral  ?   SpO2 05/25/21 1915 100 %  ?   Weight --   ?   Height --   ?   Head Circumference --   ?   Peak Flow --   ?   Pain Score 05/25/21 1914 6  ?   Pain Loc --   ?  Pain Edu? --   ?   Excl. in [place]? --   ? ?No data found. ? ?Updated Vital Signs ?BP 116/66 (BP Location: Right Arm)   Pulse 67   Temp 98.9 ?F (37.2 ?C) (Oral)   Resp 14   LMP 05/21/2021   SpO2 100%  ? ?Visual Acuity ?Right Eye Distance:   ?Left Eye Distance:   ?Bilateral Distance:   ? ?Right Eye Near:   ?Left Eye Near:    ?Bilateral Near:    ? ?Physical Exam ?Vitals and nursing note reviewed.  ?Constitutional:   ?   General: She is in acute distress.  ?   Appearance: Normal appearance.  ?HENT:  ?   Right Ear: Tympanic membrane normal.  ?   Left Ear: Tympanic membrane normal.  ?   Mouth/Throat:  ?   Mouth: Mucous membranes are moist.  ?   Comments: Multiple dental cavities involving the premolars in the molars bilaterally. ?Cardiovascular:  ?   Rate and Rhythm: Normal rate and regular rhythm.  ?   Pulses: Normal pulses.  ?   Heart sounds: Normal heart sounds.  ?Pulmonary:  ?   Effort: Pulmonary effort is normal.  ?   Breath sounds: Normal breath sounds.  ?Musculoskeletal:     ?   General: No  swelling or tenderness. Normal range of motion.  ?Neurological:  ?   Mental Status: She is alert.  ? ? ? ?UC Treatments / Results  ?Labs ?(all labs ordered are listed, but only abnormal results are displayed) ?Labs Reviewed - No data to display ? ?EKG ? ? ?Radiology ?No results found. ? ?Procedures ?Procedures (including critical care time) ? ?Medications Ordered in UC ?Medications - No data to display ? ?Initial Impression / Assessment and Plan / UC Course  ?I have reviewed the triage vital signs and the nursing notes. ? ?Pertinent labs & imaging results that were available during my care of the patient were reviewed by me and considered in my medical decision making (see chart for details). ? ?  ? ?1.  Dental infection: ?Augmentin 875-125 mg twice daily for 7 days ?Ibuprofen as needed for pain ?Chlorhexidine 4% as needed ?Return precautions given. ?Final Clinical Impressions(s) / UC Diagnoses  ? ?Final diagnoses:  ?Dental infection  ? ? ? ?Discharge Instructions   ? ?  ?This take medications as prescribed ?Please refer to the local dental services list and make an appointment to have dental care. ?Return to urgent care if you have any other concerns ? ? ?ED Prescriptions   ? ? Medication Sig Dispense Auth. Provider  ? chlorhexidine (HIBICLENS) 4 % external liquid Apply topically daily as needed. 120 mL Briannie Gutierrez, Britta Mccreedy, MD  ? ibuprofen (ADVIL) 600 MG tablet Take 1 tablet (600 mg total) by mouth every 6 (six) hours as needed. 30 tablet Abdias Hickam, Britta Mccreedy, MD  ? amoxicillin-clavulanate (AUGMENTIN) 875-125 MG tablet Take 1 tablet by mouth every 12 (twelve) hours. 14 tablet Aalliyah Kilker, Britta Mccreedy, MD  ? ?  ? ?PDMP not reviewed this encounter. ?  ?Merrilee Jansky, MD ?05/25/21 2004 ? ?

## 2021-05-25 NOTE — Discharge Instructions (Addendum)
This take medications as prescribed ?Please refer to the local dental services list and make an appointment to have dental care. ?Return to urgent care if you have any other concerns ?

## 2021-05-25 NOTE — ED Triage Notes (Signed)
Pt c/o right dental pain and swelling for week.  ?

## 2021-06-17 ENCOUNTER — Ambulatory Visit: Payer: No Typology Code available for payment source | Admitting: Family Medicine

## 2021-07-09 ENCOUNTER — Ambulatory Visit: Payer: No Typology Code available for payment source | Admitting: Psychiatry

## 2021-11-05 ENCOUNTER — Ambulatory Visit (HOSPITAL_COMMUNITY)
Admission: EM | Admit: 2021-11-05 | Discharge: 2021-11-05 | Disposition: A | Discharge status: Home / Self Care | Payer: No Typology Code available for payment source | Attending: Family Medicine | Admitting: Family Medicine

## 2021-11-05 ENCOUNTER — Encounter (HOSPITAL_COMMUNITY): Payer: Self-pay

## 2021-11-05 DIAGNOSIS — H6693 Otitis media, unspecified, bilateral: Secondary | ICD-10-CM | POA: Insufficient documentation

## 2021-11-05 DIAGNOSIS — Z1152 Encounter for screening for COVID-19: Secondary | ICD-10-CM | POA: Insufficient documentation

## 2021-11-05 DIAGNOSIS — J069 Acute upper respiratory infection, unspecified: Secondary | ICD-10-CM | POA: Insufficient documentation

## 2021-11-05 LAB — RESP PANEL BY RT-PCR (FLU A&B, COVID) ARPGX2
Influenza A by PCR: NEGATIVE
Influenza B by PCR: NEGATIVE
SARS Coronavirus 2 by RT PCR: NEGATIVE

## 2021-11-05 MED ORDER — AMOXICILLIN-POT CLAVULANATE 875-125 MG PO TABS: 1.0000 | ORAL_TABLET | Freq: Two times a day (BID) | ORAL | 0 refills | Status: DC

## 2021-11-05 NOTE — ED Provider Notes (Signed)
[facility]    CSN: [id number] Arrival date & time: 11/05/21  1849      History   Chief Complaint Chief Complaint  Patient presents with   URI   Chills    HPI Alcie Runions is a 41 y.o. female.    URI  Here for cough and congestion and headache that began on October 15.  Then 2 days ago she started feeling worse and her ears been hurting.  She has had fever and her temperature here is 99.9.  No nausea or vomiting or diarrhea.  She has felt some shortness of breath.   Last menstrual cycle was about 10 days ago. 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      Home Medications    Prior to Admission medications   Medication Sig Start Date End Date Taking? Authorizing Provider  ibuprofen (ADVIL) 600 MG tablet Take 1 tablet (600 mg total) by mouth every 6 (six) hours as needed. 05/25/21  Yes Lamptey, Myrene Galas, MD  amoxicillin-clavulanate (AUGMENTIN) 875-125 MG tablet Take 1 tablet by mouth every 12 (twelve) hours. 05/25/21   LampteyMyrene Galas, MD  chlorhexidine (HIBICLENS) 4 % external liquid Apply topically daily as needed. 05/25/21   Lamptey, Myrene Galas, MD    Family History Family History  Problem Relation Age of Onset   Diabetes Mother    CAD Mother    Cancer Father    Diabetes Father    Colon cancer Father    Esophageal cancer Neg Hx    Rectal cancer Neg Hx    Stomach cancer Neg Hx     Social History Social History   Tobacco Use    Smoking status: Some Days    Types: Cigarettes   Smokeless tobacco: Never  Vaping Use   Vaping Use: Never used  Substance Use Topics   Alcohol use: Not Currently   Drug use: Never     Allergies   Patient has no known allergies.   Review of Systems Review of Systems   Physical Exam Triage Vital Signs ED Triage Vitals  Enc Vitals Group     BP 11/05/21 1928 128/79     Pulse Rate 11/05/21 1928 (!) 115     Resp 11/05/21 1928 16     Temp 11/05/21 1928 99.9 F (37.7 C)     Temp Source 11/05/21 1928 Oral     SpO2 11/05/21 1928 100 %     Weight --      Height --      Head Circumference --      Peak Flow --      Pain Score 11/05/21 1927 7     Pain Loc --      Pain Edu? --      Excl. in [place]? --    No data found.  Updated Vital Signs BP 128/79 (BP Location: Left Arm)   Pulse (!) 115  Temp 99.9 F (37.7 C) (Oral)   Resp 16   LMP 10/23/2021 (Exact Date)   SpO2 100%   Visual Acuity Right Eye Distance:   Left Eye Distance:   Bilateral Distance:    Right Eye Near:   Left Eye Near:    Bilateral Near:     Physical Exam Vitals reviewed.  Constitutional:      General: She is not in acute distress.    Appearance: She is not toxic-appearing.  HENT:     Ears:     Comments: Left tympanic membrane is red and dull and bulging with altered landmarks.  Right tympanic membrane is red and dull    Nose: Nose normal.     Mouth/Throat:     Mouth: Mucous membranes are moist.     Pharynx: No oropharyngeal exudate or posterior oropharyngeal erythema.  Eyes:     Extraocular Movements: Extraocular movements intact.     Conjunctiva/sclera: Conjunctivae normal.     Pupils: Pupils are equal, round, and reactive to light.  Cardiovascular:     Rate and Rhythm: Normal rate and regular rhythm.     Heart sounds: No murmur heard. Pulmonary:     Effort: Pulmonary effort is normal. No respiratory distress.     Breath sounds: No stridor. No wheezing, rhonchi or rales.   Musculoskeletal:     Cervical back: Neck supple.  Lymphadenopathy:     Cervical: No cervical adenopathy.  Skin:    Capillary Refill: Capillary refill takes less than 2 seconds.     Coloration: Skin is not jaundiced or pale.  Neurological:     General: No focal deficit present.     Mental Status: She is alert and oriented to person, place, and time.  Psychiatric:        Behavior: Behavior normal.      UC Treatments / Results  Labs (all labs ordered are listed, but only abnormal results are displayed) Labs Reviewed - No data to display  EKG   Radiology No results found.  Procedures Procedures (including critical care time)  Medications Ordered in UC Medications - No data to display  Initial Impression / Assessment and Plan / UC Course  I have reviewed the triage vital signs and the nursing notes.  Pertinent labs & imaging results that were available during my care of the patient were reviewed by me and considered in my medical decision making (see chart for details).        I am going to treat with Augmentin for her bilateral ear infection.  I did explain that the congestion and cough is more likely early in origin.  COVID swab and flu swab is done.  If flu is positive she is outside the window for treatment.  If COVID is positive she should have a prescription for Paxlovid; last EGFR was over 60 Final Clinical Impressions(s) / UC Diagnoses   Final diagnoses:  None   Discharge Instructions   None    ED Prescriptions   None    PDMP not reviewed this encounter.   Barrett Henle, MD 11/05/21 [phone]

## 2021-11-05 NOTE — Discharge Instructions (Addendum)
Take amoxicillin-clavulanate 875 mg--1 tab twice daily with food for 7 days (This is for the ear infection)   You have been swabbed for COVID/flu, and the test will result in the next 24 hours. Our staff will call you if positive. If the COVID test is positive, you should quarantine for 5 days from the start of your symptoms

## 2021-11-05 NOTE — ED Triage Notes (Signed)
Patient having cough, body aches, sore throat, chills, ear pain, and SOB onset 4 days ago but worse in the last 2 or so days.  Patient states her nieces are sick with an ear infection and the other has not been tested for anything.

## 2021-11-09 ENCOUNTER — Encounter (HOSPITAL_COMMUNITY): Payer: Self-pay | Admitting: Emergency Medicine

## 2021-11-09 ENCOUNTER — Ambulatory Visit (HOSPITAL_COMMUNITY)
Admission: EM | Admit: 2021-11-09 | Discharge: 2021-11-09 | Disposition: A | Discharge status: Home / Self Care | Payer: Self-pay | Attending: Nurse Practitioner | Admitting: Nurse Practitioner

## 2021-11-09 DIAGNOSIS — J069 Acute upper respiratory infection, unspecified: Secondary | ICD-10-CM

## 2021-11-09 DIAGNOSIS — T3695XA Adverse effect of unspecified systemic antibiotic, initial encounter: Secondary | ICD-10-CM

## 2021-11-09 DIAGNOSIS — H6693 Otitis media, unspecified, bilateral: Secondary | ICD-10-CM

## 2021-11-09 DIAGNOSIS — R112 Nausea with vomiting, unspecified: Secondary | ICD-10-CM

## 2021-11-09 MED ORDER — FLUTICASONE PROPIONATE 50 MCG/ACT NA SUSP: 2.0000 | Freq: Every day | NASAL | 0 refills | Status: AC

## 2021-11-09 MED ORDER — BENZONATATE 100 MG PO CAPS: 200.0000 mg | ORAL_CAPSULE | Freq: Three times a day (TID) | ORAL | 0 refills | Status: AC | PRN

## 2021-11-09 MED ORDER — AMOXICILLIN 875 MG PO TABS: 875.0000 mg | ORAL_TABLET | Freq: Two times a day (BID) | ORAL | 0 refills | Status: AC

## 2021-11-09 MED ORDER — ONDANSETRON 8 MG PO TBDP: 4.0000 mg | ORAL_TABLET | Freq: Three times a day (TID) | ORAL | 0 refills | Status: AC | PRN

## 2021-11-09 NOTE — Discharge Instructions (Addendum)
Take medications as prescribed.  You may take Tylenol or ibuprofen as needed for fever, pain and/or body aches Drink plenty of fluids.  Change toothbrush after finishing medications.  Follow-up as needed.

## 2021-11-09 NOTE — ED Provider Notes (Signed)
[facility]    CSN: [id number] Arrival date & time: 11/09/21  1830      History   Chief Complaint Chief Complaint  Patient presents with   Medication Reaction    HPI Kelli Russo is a 41 y.o. female.   Subjective:   Kelli Russo is a 42 y.o. female who presents for evaluation of nausea and vomiting. Onset of symptoms was 2 days ago. Patient describes nausea as severe. Vomiting has occurred several times since onset. Vomitus is described as undigested food, dry heaves. Symptoms have been associated with diarrhea and ability to keep down some fluids.  Patient denies alcohol overuse, fever, hematemesis, melena, and possibility of pregnancy. Symptoms have essentially resolved after stopping her prescribed antibiotics.  Patient was seen here on 11/05/21 and diagnosed with bilateral otitis media.  She was prescribed Augmentin.  The above symptoms started after only 2 doses of the medication.  Patient reports that she feels as the medication has been helpful because her ear pain is better.  She still has some congestion and coughing which she is controlling with over-the-counter medications.    The following portions of the patient's history were reviewed and updated as appropriate: allergies, current medications, past family history, past medical history, past social history, past surgical history, and problem list.        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      Home Medications    Prior to Admission medications   Medication Sig Start Date End Date Taking? Authorizing Provider  amoxicillin (AMOXIL) 875 MG tablet Take 1 tablet (875 mg total) by mouth 2 (two) times daily for 7 days. Take with food to prevent stomach upset/vomiting 11/09/21 11/16/21 Yes Lurline Idol, FNP  benzonatate (TESSALON) 100 MG capsule Take 2 capsules (200 mg total) by mouth 3 (three) times daily as needed for cough. 11/09/21  Yes Lurline Idol, FNP  fluticasone (FLONASE) 50 MCG/ACT nasal spray Place 2 sprays into both nostrils daily. 11/09/21  Yes Lurline Idol, FNP  ondansetron (ZOFRAN-ODT) 8 MG disintegrating tablet Take 0.5 tablets (4 mg total) by mouth every 8 (eight) hours as needed for nausea or vomiting. 11/09/21  Yes Lurline Idol, FNP  chlorhexidine (HIBICLENS) 4 % external liquid Apply topically daily as needed. 05/25/21   Lamptey, Britta Mccreedy, MD  ibuprofen (ADVIL) 600 MG tablet Take 1 tablet (600 mg total) by mouth every 6 (six) hours as needed. 05/25/21   Lamptey, Britta Mccreedy, MD    Family History Family History  Problem Relation Age of Onset   Diabetes Mother    CAD Mother    Cancer Father    Diabetes Father    Colon cancer Father    Esophageal cancer Neg Hx    Rectal cancer Neg Hx    Stomach cancer Neg Hx     Social History Social History   Tobacco Use   Smoking status: Some Days  Types: Cigarettes   Smokeless tobacco: Never  Vaping Use   Vaping Use: Never used  Substance Use Topics   Alcohol use: Not Currently   Drug use: Never     Allergies   Patient has no known allergies.   Review of Systems Review of Systems  Constitutional:  Negative for fever.  HENT:  Positive for congestion and ear pain.   Respiratory:  Positive for cough.   Gastrointestinal:  Positive for diarrhea, nausea and vomiting.  Musculoskeletal:  Positive for myalgias.  Neurological:  Positive for headaches.  All other systems  reviewed and are negative.    Physical Exam Triage Vital Signs ED Triage Vitals [11/09/21 1939]  Enc Vitals Group     BP 126/79     Pulse Rate 86     Resp 15     Temp 98.3 F (36.8 C)     Temp Source Oral     SpO2 98 %     Weight      Height      Head Circumference      Peak Flow      Pain Score 0     Pain Loc      Pain Edu?      Excl. in [place]?    No data found.  Updated Vital Signs BP 126/79 (BP Location: Right Arm)   Pulse 86   Temp 98.3 F (36.8 C) (Oral)   Resp 15   LMP 10/23/2021 (Exact Date)   SpO2 98%   Visual Acuity Right Eye Distance:   Left Eye Distance:   Bilateral Distance:    Right Eye Near:   Left Eye Near:    Bilateral Near:     Physical Exam Vitals reviewed.  Constitutional:      General: She is not in acute distress.    Appearance: Normal appearance. She is not ill-appearing or toxic-appearing.  HENT:     Head: Normocephalic.     Right Ear: Hearing normal. No drainage or swelling. Tympanic membrane is injected.     Left Ear: Hearing normal. No drainage or swelling. Tympanic membrane is injected.     Nose: Congestion present.     Mouth/Throat:     Mouth: Mucous membranes are moist.  Eyes:     Conjunctiva/sclera: Conjunctivae normal.  Cardiovascular:     Rate and Rhythm: Normal rate.  Pulmonary:     Effort: Pulmonary effort is normal.  Abdominal:     Palpations: Abdomen is soft.     Tenderness: There is no abdominal tenderness.  Musculoskeletal:        General: Normal range of motion.     Cervical back: Normal range of motion and neck supple.  Lymphadenopathy:     Cervical: No cervical adenopathy.  Skin:    General: Skin is warm and dry.  Neurological:     General: No focal deficit present.     Mental Status: She is alert and oriented to person, place, and time.      UC Treatments / Results  Labs (all labs ordered are listed, but only abnormal results are displayed) Labs Reviewed - No data to  display  EKG   Radiology No results found.  Procedures Procedures (including critical care time)  Medications Ordered in UC Medications - No data to display  Initial Impression / Assessment and Plan / UC Course  I have reviewed the triage vital signs and the nursing notes.  Pertinent labs & imaging results that were available during  my care of the patient were reviewed by me and considered in my medical decision making (see chart for details).    41 year old female presenting with nausea, vomiting and diarrhea after being prescribed Augmentin on 11/05/2021.  Symptoms have since resolved after she stopped taking the antibiotics.  Patient was diagnosed with bilateral otitis media.  Physical exam as above.  Will replace with amoxicillin.  Advised to take with food to prevent stomach upset.  Tessalon Perles and Flonase also prescribed to help with symptomatic care.  Patient advised to drink plenty of fluids to remain hydrated. PRN antiemetic per medication orders.  Follow-up as needed  Today's evaluation has revealed no signs of a dangerous process. Discussed diagnosis with patient and/or guardian. Patient and/or guardian aware of their diagnosis, possible red flag symptoms to watch out for and need for close follow up. Patient and/or guardian understands verbal and written discharge instructions. Patient and/or guardian comfortable with plan and disposition.  Patient and/or guardian has a clear mental status at this time, good insight into illness (after discussion and teaching) and has clear judgment to make decisions regarding their care  Documentation was completed with the aid of voice recognition software. Transcription may contain typographical errors. Final Clinical Impressions(s) / UC Diagnoses   Final diagnoses:  Adverse reaction to antibiotic, initial encounter  Nausea and vomiting, unspecified vomiting type  Viral upper respiratory tract infection  Bilateral otitis media,  unspecified otitis media type     Discharge Instructions      Take medications as prescribed.  You may take Tylenol or ibuprofen as needed for fever, pain and/or body aches Drink plenty of fluids.  Change toothbrush after finishing medications.  Follow-up as needed.      ED Prescriptions     Medication Sig Dispense Auth. Provider   amoxicillin (AMOXIL) 875 MG tablet Take 1 tablet (875 mg total) by mouth 2 (two) times daily for 7 days. Take with food to prevent stomach upset/vomiting 14 tablet Joyia Riehle, Aldona Bar, FNP   ondansetron (ZOFRAN-ODT) 8 MG disintegrating tablet Take 0.5 tablets (4 mg total) by mouth every 8 (eight) hours as needed for nausea or vomiting. 16 tablet Joelyn Lover, [place], FNP   benzonatate (TESSALON) 100 MG capsule Take 2 capsules (200 mg total) by mouth 3 (three) times daily as needed for cough. 21 capsule Enrique Sack, FNP   fluticasone (FLONASE) 50 MCG/ACT nasal spray Place 2 sprays into both nostrils daily. 15.8 mL Enrique Sack, FNP      PDMP not reviewed this encounter.   Enrique Sack, [place] 11/09/21 2105

## 2021-11-09 NOTE — ED Triage Notes (Signed)
Pt seen for ear infection and now vomiting when taking antibiotics so not able to take anymore.

## 2022-01-04 ENCOUNTER — Ambulatory Visit (HOSPITAL_COMMUNITY): Admission: EM | Admit: 2022-01-04 | Discharge: 2022-01-04 | Discharge status: Against Medical Advice | Payer: Self-pay

## 2022-01-04 NOTE — ED Notes (Signed)
No answer from lobby or restroom  

## 2022-01-05 ENCOUNTER — Ambulatory Visit (HOSPITAL_COMMUNITY): Admission: EM | Admit: 2022-01-05 | Discharge: 2022-01-05 | Discharge status: Against Medical Advice | Payer: Self-pay

## 2022-02-15 ENCOUNTER — Ambulatory Visit (HOSPITAL_COMMUNITY)
Admission: EM | Admit: 2022-02-15 | Discharge: 2022-02-15 | Disposition: A | Discharge status: Home / Self Care | Payer: Self-pay

## 2022-02-15 ENCOUNTER — Encounter (HOSPITAL_COMMUNITY): Payer: Self-pay

## 2022-02-15 DIAGNOSIS — R059 Cough, unspecified: Secondary | ICD-10-CM

## 2022-02-15 MED ORDER — PREDNISONE 20 MG PO TABS: 40.0000 mg | ORAL_TABLET | Freq: Every day | ORAL | 0 refills | Status: AC

## 2022-02-15 MED ORDER — ALBUTEROL SULFATE HFA 108 (90 BASE) MCG/ACT IN AERS
1.0000 | INHALATION_SPRAY | Freq: Four times a day (QID) | RESPIRATORY_TRACT | 0 refills | Status: AC | PRN
Start: 2022-02-15 — End: ?

## 2022-02-15 NOTE — Discharge Instructions (Addendum)
Take prednisone as prescribed Use albuterol inhaler as needed for wheezing or shortness of breath.   If symptoms persists follow up with pulmonologist

## 2022-02-15 NOTE — ED Triage Notes (Signed)
Pt is here for cough, SOB, chest pain , headache . Pt stated she was previously, but, she still has the cough x 6 months

## 2022-02-15 NOTE — ED Provider Notes (Signed)
[facility]    CSN: [id number] Arrival date & time: 02/15/22  1612      History   Chief Complaint Chief Complaint  Patient presents with   Cough   Nausea    HPI Kelli Russo is a 42 y.o. female.   Patient complains of persistent cough that has been going on for about 6 months.  She reports intermittent shortness of breath.  She denies congestion, wheezing, fevers, chills, body aches.  She does have a history of reflux and takes Protonix daily.  She reports GERD is well-controlled with Protonix.  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      Home Medications    Prior to Admission medications   Medication Sig Start Date End Date Taking? Authorizing Provider  albuterol (VENTOLIN HFA) 108 (90 Base) MCG/ACT inhaler Inhale 1-2 puffs into the lungs every 6 (six) hours as needed for wheezing or shortness of breath. 02/15/22  Yes Ward, Lenise Arena, PA-C  ibuprofen (ADVIL) 600 MG tablet Take 1 tablet (600 mg total) by mouth every 6 (six) hours as needed. 05/25/21  Yes Lamptey, Myrene Galas, MD  pantoprazole (PROTONIX) 40 MG tablet Take by mouth. 11/03/21  Yes [provider]  predniSONE (DELTASONE) 20 MG tablet Take 2 tablets (40 mg total) by mouth daily with breakfast for 5 days. 02/15/22 02/20/22 Yes  Ward, Lenise Arena, PA-C  benzonatate (TESSALON) 100 MG capsule Take 2 capsules (200 mg total) by mouth 3 (three) times daily as needed for cough. 11/09/21   Enrique Sack, FNP  chlorhexidine (HIBICLENS) 4 % external liquid Apply topically daily as needed. 05/25/21   Chase Picket, MD  fluticasone (FLONASE) 50 MCG/ACT nasal spray Place 2 sprays into both nostrils daily. 11/09/21   Enrique Sack, FNP  ondansetron (ZOFRAN-ODT) 8 MG disintegrating tablet Take 0.5 tablets (4 mg total) by mouth every 8 (eight) hours as needed for nausea or vomiting. 11/09/21   Enrique Sack, FNP    Family History Family History  Problem Relation Age of Onset   Diabetes Mother    CAD Mother    Cancer Father    Diabetes Father    Colon cancer Father    Esophageal cancer Neg Hx    Rectal cancer Neg Hx    Stomach cancer Neg Hx     Social History Social History   Tobacco Use   Smoking status: Some Days    Types: Cigarettes   Smokeless tobacco: Never  Vaping Use   Vaping Use: Never used  Substance Use Topics   Alcohol use: Not Currently   Drug use: Never     Allergies   Patient has no known allergies.   Review of Systems Review of Systems  Constitutional:  Negative for chills and  fever.  HENT:  Negative for ear pain and sore throat.   Eyes:  Negative for pain and visual disturbance.  Respiratory:  Positive for cough and shortness of breath.   Cardiovascular:  Negative for chest pain and palpitations.  Gastrointestinal:  Negative for abdominal pain and vomiting.  Genitourinary:  Negative for dysuria and hematuria.  Musculoskeletal:  Negative for arthralgias and back pain.  Skin:  Negative for color change and rash.  Neurological:  Negative for seizures and syncope.  All other systems reviewed and are negative.    Physical Exam Triage Vital Signs ED Triage Vitals  Enc Vitals Group     BP 02/15/22 1836 115/83     Pulse Rate 02/15/22 1836 72     Resp 02/15/22 1836 16     Temp  02/15/22 1836 98.3 F (36.8 C)     Temp Source 02/15/22 1836 Oral     SpO2 02/15/22 1836 98 %     Weight --      Height --      Head Circumference --      Peak Flow --      Pain Score 02/15/22 1831 0     Pain Loc --      Pain Edu? --      Excl. in [place]? --    No data found.  Updated Vital Signs BP 115/83 (BP Location: Left Arm)   Pulse 72   Temp 98.3 F (36.8 C) (Oral)   Resp 16   LMP 02/12/2022   SpO2 98%   Visual Acuity Right Eye Distance:   Left Eye Distance:   Bilateral Distance:    Right Eye Near:   Left Eye Near:    Bilateral Near:     Physical Exam Vitals and nursing note reviewed.  Constitutional:      General: She is not in acute distress.    Appearance: She is well-developed.  HENT:     Head: Normocephalic and atraumatic.  Eyes:     Conjunctiva/sclera: Conjunctivae normal.  Cardiovascular:     Rate and Rhythm: Normal rate and regular rhythm.     Heart sounds: No murmur heard. Pulmonary:     Effort: Pulmonary effort is normal. No respiratory distress.     Breath sounds: Normal breath sounds.  Abdominal:     Palpations: Abdomen is soft.     Tenderness: There is no abdominal tenderness.  Musculoskeletal:        General: No swelling.     Cervical back: Neck supple.  Skin:    General: Skin is warm and dry.     Capillary Refill: Capillary refill takes less than 2 seconds.  Neurological:     Mental Status: She is alert.  Psychiatric:        Mood and Affect: Mood normal.      UC Treatments / Results  Labs (all labs ordered are listed, but only abnormal results are displayed) Labs Reviewed - No data to display  EKG   Radiology No results found.  Procedures Procedures (including critical care time)  Medications Ordered in UC Medications - No data to display  Initial Impression / Assessment and Plan / UC Course  I have reviewed the triage vital signs and the nursing notes.  Pertinent labs & imaging results that were available during my  care of the patient were reviewed by me and considered in my medical decision making (see chart for details).     Lungs are clear on exam.  Patient overall well appearing in no acute distress.  No wheezing noted.  Advise follow-up with pulmonologist given chronicity of symptoms.  Short course of steroid given today.  Albuterol given to use as needed. Final Clinical Impressions(s) / UC Diagnoses   Final diagnoses:  Cough, unspecified type     Discharge Instructions      Take prednisone as prescribed Use albuterol inhaler as needed for wheezing or shortness of breath.   If symptoms persists follow up with pulmonologist    ED Prescriptions     Medication Sig Dispense Auth. Provider   predniSONE (DELTASONE) 20 MG tablet Take 2 tablets (40 mg total) by mouth daily with breakfast for 5 days. 10 tablet Ward, Tylene Fantasia, PA-C   albuterol (VENTOLIN HFA) 108 (90 Base) MCG/ACT inhaler Inhale 1-2 puffs into the lungs every 6 (six) hours as needed for wheezing or shortness of breath. 1 each Ward, Tylene Fantasia, PA-C      PDMP not reviewed this encounter.   Ward, Tylene Fantasia, PA-C 02/15/22 1911

## 2023-09-29 IMAGING — RF DG UGI W SINGLE CM
14 of 23 series · 14 of 23 positions shown · IV contrast (omnipaque)
Comparison: None.

CLINICAL DATA: cholecystoduodenal fistula, S/P repair, R/O post-op
duodenal leak

EXAM:
WATER SOLUBLE UPPER GI SERIES
TECHNIQUE: Single-column upper GI series was performed using water soluble
contrast.
CONTRAST:  100 cc of Omnipaque 300

[Series 1: t abdomen supine · 0.15mm/px · 1 of 1 slices shown]
[im 1/1]
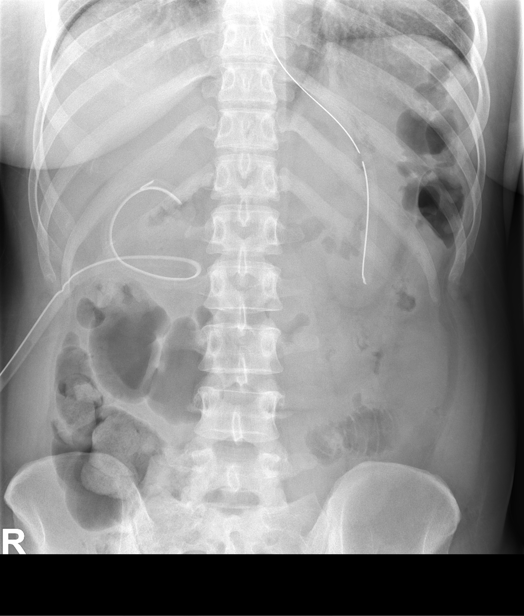

[Series 3: cp_standard · 0.26mm/px · 1 of 1 slices shown (1 of 11)]
[im 1/1]
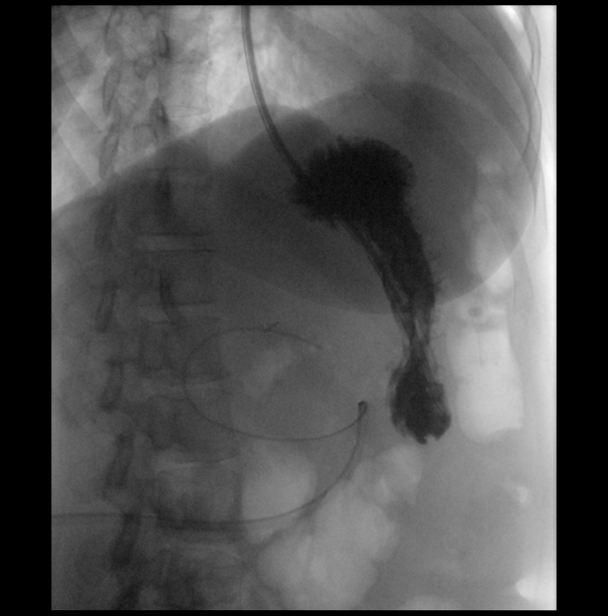

[Series 5: cp_standard · 0.26mm/px · 1 of 1 slices shown (2 of 11)]
[im 1/1]
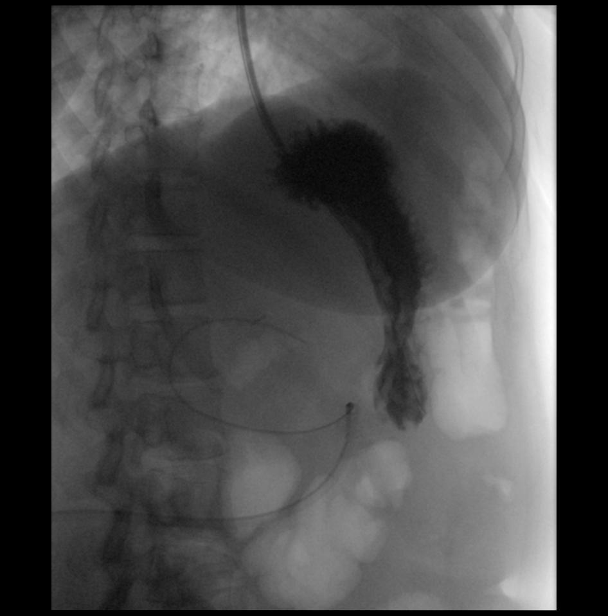

[Series 6: cp_standard · 0.26mm/px · 1 of 1 slices shown (3 of 11)]
[im 1/1]
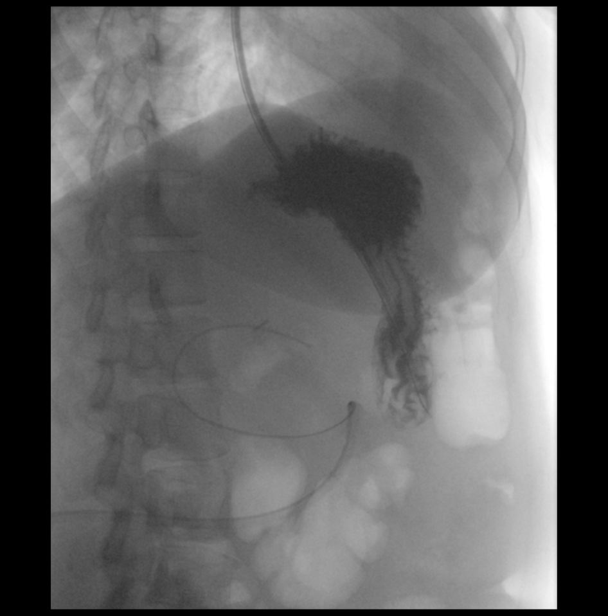

[Series 8: cp_standard · 0.27mm/px · 1 of 1 slices shown (4 of 11)]
[im 1/1]
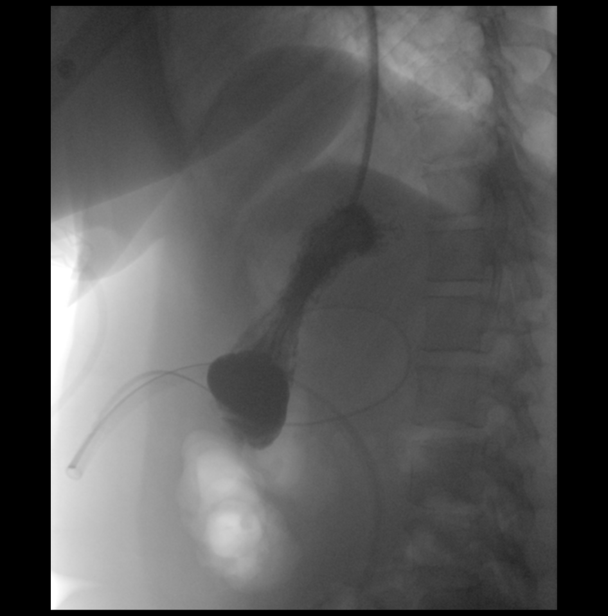

[Series 10: cp_standard · 0.27mm/px · 1 of 1 slices shown (5 of 11)]
[im 1/1]
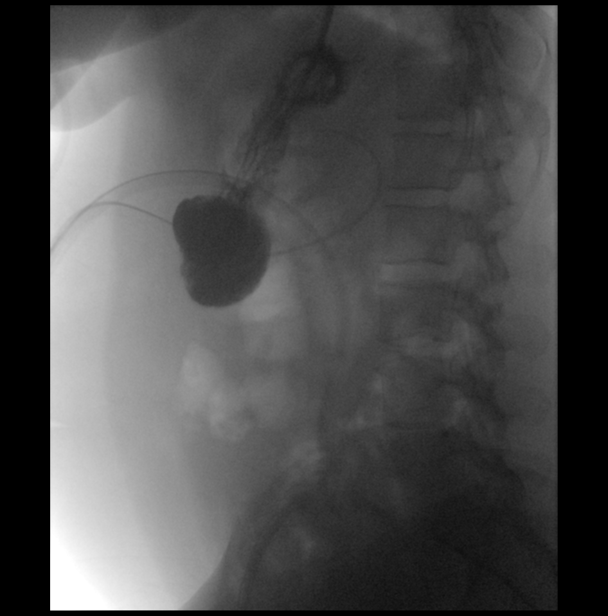

[Series 11: cp_standard · 0.18mm/px · 1 of 1 slices shown (6 of 11)]
[im 1/1]
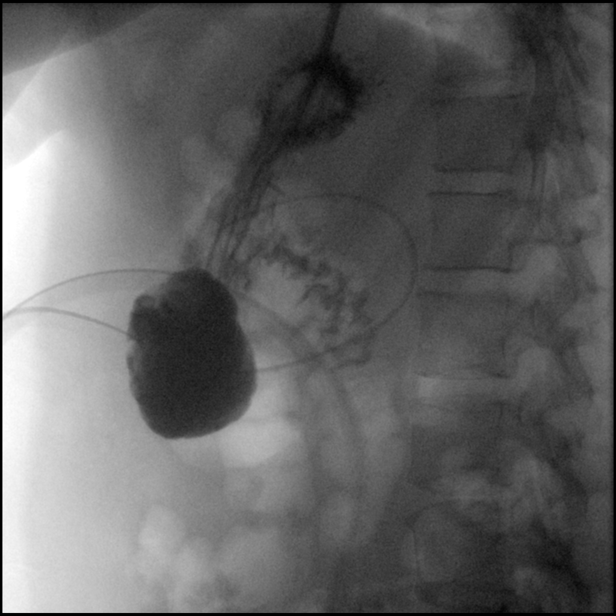

[Series 13: cp_standard · 0.18mm/px · 1 of 1 slices shown (7 of 11)]
[im 1/1]
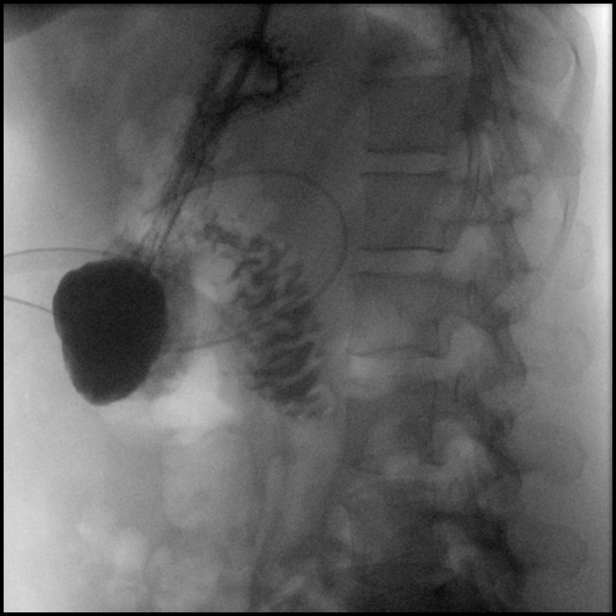

[Series 14: cp_standard · 0.18mm/px · 1 of 1 slices shown (8 of 11)]
[im 1/1]
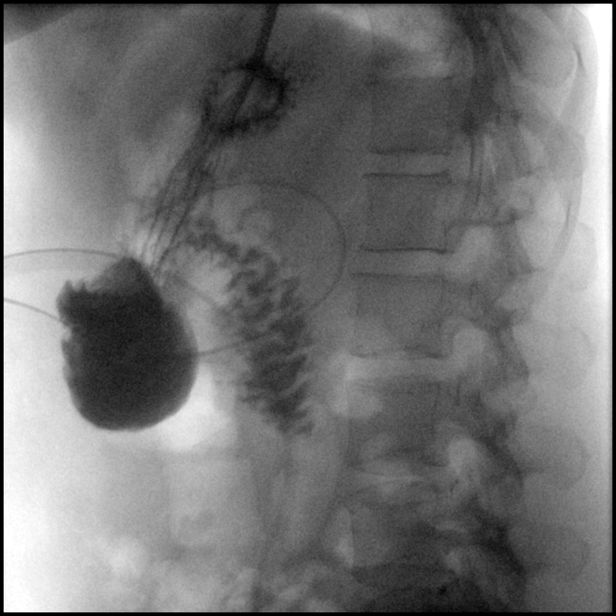

[Series 16: fluoro_barium singleshot_bw · 0.18mm/px · 1 of 1 slices shown (1 of 2)]
[im 1/1]
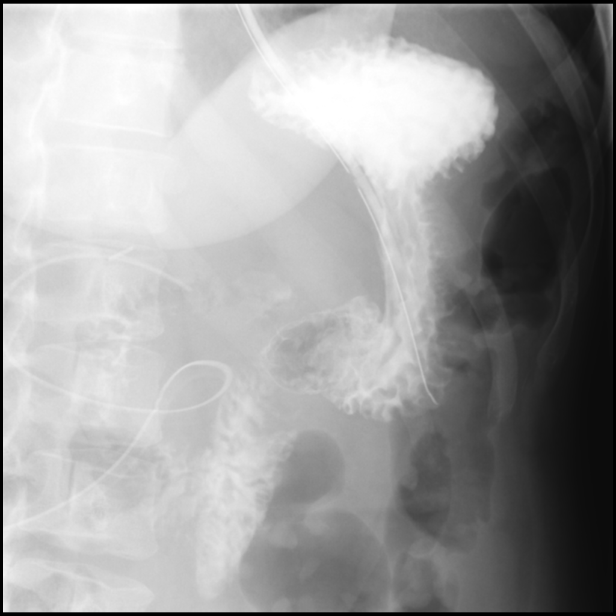

[Series 18: cp_standard · 0.18mm/px · 1 of 1 slices shown (9 of 11)]
[im 1/1]
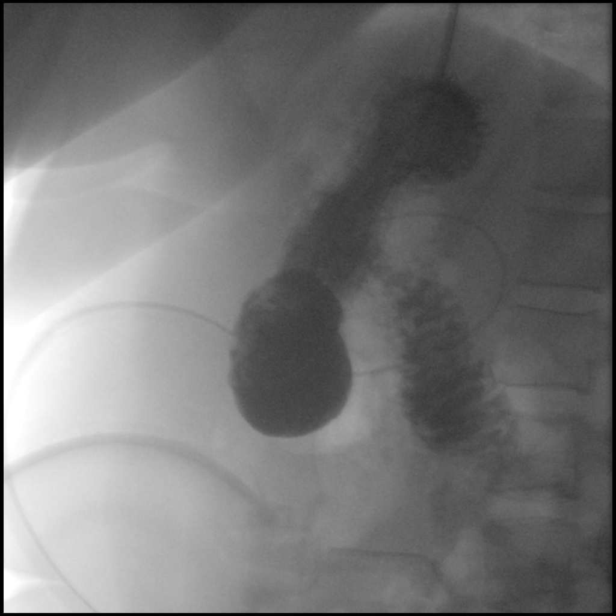

[Series 19: cp_standard · 0.18mm/px · 1 of 1 slices shown (10 of 11)]
[im 1/1]
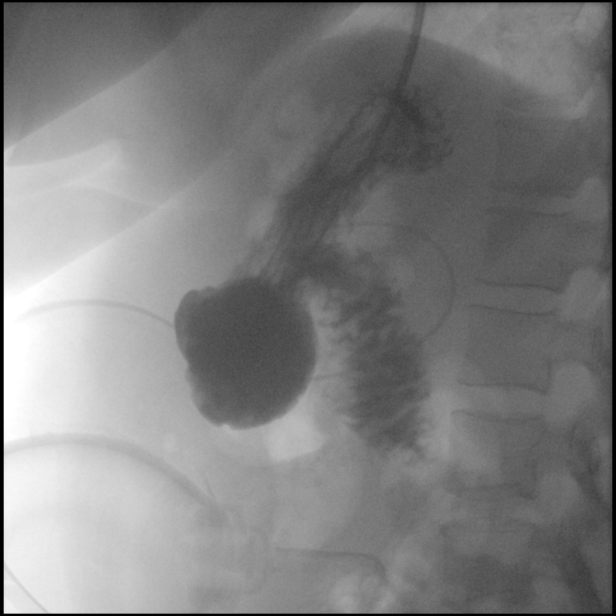

[Series 21: cp_standard · 0.18mm/px · 1 of 1 slices shown (11 of 11)]
[im 1/1]
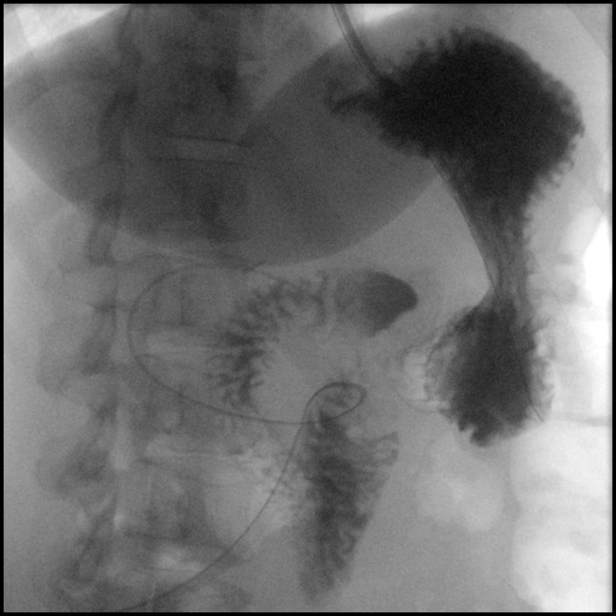

[Series 23: fluoro_barium singleshot_bw · 0.18mm/px · 1 of 1 slices shown (2 of 2)]
[im 1/1]
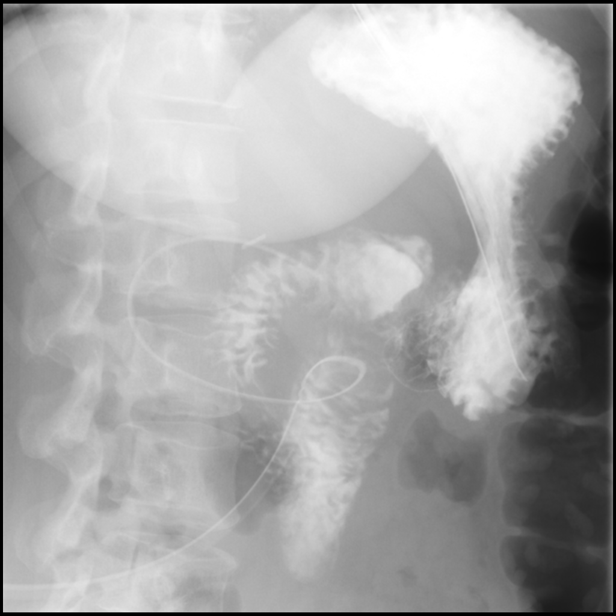

[14 of 23 positions shown; findings below may reference images not displayed]

FLUOROSCOPY TIME:  Fluoroscopy Time:  4 minutes 36 seconds

Radiation Exposure Index (if provided by the fluoroscopic device):
82.7 mGy

Number of Acquired Spot Images: 0
FINDINGS: On the scout radiograph surgical clips from cholecystectomy
identified. A surgical drainage catheter overlies the right upper
quadrant of the abdomen. There is a nasogastric tube tip and side
port within the stomach.

The nasogastric tube was accessed and 100 cc of Omnipaque 300 were
injected by the technologist. Expected contrast opacification of the
stomach was observed. Patient was then placed in the right lateral
orientation and emptying of the stomach into the duodenum was
observed. The patient was moved into the LPO orientation resulting
in opacification of the entire duodenal C loop. No contrast
extravasation identified to suggest leak.
IMPRESSION: 1. No contrast extravasation identified from the stomach or duodenum
to suggest postoperative leak.

## 2024-02-07 ENCOUNTER — Encounter (HOSPITAL_COMMUNITY): Payer: Self-pay

## 2024-02-07 ENCOUNTER — Ambulatory Visit (HOSPITAL_COMMUNITY)
Admission: EM | Admit: 2024-02-07 | Discharge: 2024-02-07 | Disposition: A | Discharge status: Home / Self Care | Attending: Emergency Medicine | Admitting: Emergency Medicine

## 2024-02-07 DIAGNOSIS — J0100 Acute maxillary sinusitis, unspecified: Secondary | ICD-10-CM | POA: Diagnosis not present

## 2024-02-07 MED ORDER — DOXYCYCLINE HYCLATE 100 MG PO TABS: 100.0000 mg | ORAL_TABLET | Freq: Two times a day (BID) | ORAL | 0 refills | Status: AC

## 2024-02-07 NOTE — Discharge Instructions (Addendum)
 Take the antibiotic twice daily with food  Try the nasal sinus rinse with filtered water  Tylenol  and ibuprofen  as needed for pain   Symptoms should improve with medication, if no improvement or any changes return to clinic

## 2024-02-07 NOTE — ED Triage Notes (Signed)
 Patient reports that she began having respiratory symptoms/cold on 01/04/24. Patient then says she was diagnosed with a bacterial URI on 12/30 and was prescribed antibiotics and Sudafed.  Patient says she is still having watery eyes, nasal congestion and a productive cough with light tan sputum.

## 2024-02-07 NOTE — ED Provider Notes (Signed)
 " [facility]    CSN: [id number] Arrival date & time: 02/07/24  1849      History   Chief Complaint Chief Complaint  Patient presents with   watery eyes   Nasal Congestion   Cough    HPI Kelli Russo is a 44 y.o. female.   Patient presents to clinic over concern of ongoing sinus pressure, nasal congestion and a cough for the past 5 weeks.  Was seen at [facility] on 12/30 and diagnosed w/ bacterial URI, given Z-pack.   Finished Z-pack and then after which she took a whole bottle of Sudafed to help with her congestion.  Z-Pak helped a little but the Sudafed did not.  Continues having nasal congestion and post-nasal drip  Coughing up tan phlegm Denies fever, wheezing or shortness of breath Maxillary sinus pressure and nasal pressure    The history is provided by the patient and medical records.  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      Home Medications    Prior to Admission medications  Medication Sig Start Date End Date Taking? Authorizing Provider  doxycycline  (VIBRA -TABS) 100 MG tablet Take 1 tablet (100 mg total) by mouth 2 (two) times daily for 7 days. 02/07/24 02/14/24 Yes Ball, Lorrinda Ramstad  G, FNP  albuterol  (VENTOLIN  HFA) 108 (90 Base) MCG/ACT inhaler Inhale 1-2 puffs into the lungs every 6 (six) hours as needed for wheezing or shortness of breath. Patient not taking: Reported on 02/07/2024 02/15/22   Ward, Harlene PEDLAR, PA-C   benzonatate  (TESSALON ) 100 MG capsule Take 2 capsules (200 mg total) by mouth 3 (three) times daily as needed for cough. Patient not taking: Reported on 02/07/2024 11/09/21   Iola Lukes, FNP  chlorhexidine  (HIBICLENS ) 4 % external liquid Apply topically daily as needed. Patient not taking: Reported on 02/07/2024 05/25/21   Blaise Aleene KIDD, MD  fluticasone  (FLONASE ) 50 MCG/ACT nasal spray Place 2 sprays into both nostrils daily. Patient not taking: Reported on 02/07/2024 11/09/21   Murrill, Samantha, FNP  ibuprofen  (ADVIL ) 600 MG tablet Take 1 tablet (600 mg total) by mouth every 6 (six) hours as needed. Patient not taking: Reported on 02/07/2024 05/25/21   Blaise Aleene KIDD, MD  ondansetron  (ZOFRAN -ODT) 8 MG disintegrating tablet Take 0.5 tablets (4 mg total) by mouth every 8 (eight) hours as needed for nausea or vomiting. Patient not taking: Reported on 02/07/2024 11/09/21   Iola Lukes, FNP  pantoprazole  (PROTONIX ) 40 MG tablet Take by mouth. Patient not taking: Reported on 02/07/2024 11/03/21   [provider]    Family History Family History  Problem Relation Age of Onset   Diabetes Mother    CAD Mother    Cancer Father    Diabetes Father    Colon cancer Father    Esophageal cancer Neg Hx    Rectal cancer Neg Hx    Stomach cancer Neg Hx  Social History Social History[1]   Allergies   Patient has no known allergies.   Review of Systems Review of Systems  Per HPI  Physical Exam Triage Vital Signs ED Triage Vitals [02/07/24 1946]  Encounter Vitals Group     BP 133/84     Girls Systolic BP Percentile      Girls Diastolic BP Percentile      Boys Systolic BP Percentile      Boys Diastolic BP Percentile      Pulse Rate 72     Resp 14     Temp 98.3 F (36.8 C)     Temp Source Oral     SpO2 98 %     Weight      Height      Head Circumference      Peak Flow      Pain Score 0     Pain Loc      Pain Education      Exclude from Growth Chart     No data found.  Updated Vital Signs BP 133/84 (BP Location: Left Arm)   Pulse 72   Temp 98.3 F (36.8 C) (Oral)   Resp 14   LMP 01/29/2024 (Exact Date)   SpO2 98%   Visual Acuity Right Eye Distance:   Left Eye Distance:   Bilateral Distance:    Right Eye Near:   Left Eye Near:    Bilateral Near:     Physical Exam Vitals and nursing note reviewed.  Constitutional:      Appearance: Normal appearance.  HENT:     Head: Normocephalic and atraumatic.     Right Ear: External ear normal.     Left Ear: External ear normal.     Nose: Congestion present.     Mouth/Throat:     Mouth: Mucous membranes are moist.  Eyes:     Conjunctiva/sclera: Conjunctivae normal.  Cardiovascular:     Rate and Rhythm: Normal rate and regular rhythm.     Heart sounds: Normal heart sounds. No murmur heard. Pulmonary:     Effort: Pulmonary effort is normal. No respiratory distress.     Breath sounds: Normal breath sounds.  Neurological:     General: No focal deficit present.     Mental Status: She is alert.  Psychiatric:        Mood and Affect: Mood normal.      UC Treatments / Results  Labs (all labs ordered are listed, but only abnormal results are displayed) Labs Reviewed - No data to display  EKG   Radiology No results found.  Procedures Procedures (including critical care time)  Medications Ordered in UC Medications - No data to display  Initial Impression / Assessment and Plan / UC Course  I have reviewed the triage vital signs and the nursing notes.  Pertinent labs & imaging results that were available during my care of the patient were reviewed by me and considered in my medical decision making (see chart for details).  Vitals and triage reviewed, patient is hemodynamically stable.  Lungs vesicular, heart with regular rate and rhythm.  Congestion and postnasal drip present with right-sided maxillary sinus tenderness to palpation.  Due to prolonged duration will cover  with doxycycline  for acute bacterial sinusitis.  Patient unsure but thinks Augmentin  makes her vomit.  Advised sinus rinses and symptomatic management for congestion.  Plan of care, follow-up care, and return precautions given, no questions at this time.     Final Clinical  Impressions(s) / UC Diagnoses   Final diagnoses:  Acute non-recurrent maxillary sinusitis     Discharge Instructions      Take the antibiotic twice daily with food  Try the nasal sinus rinse with filtered water  Tylenol  and ibuprofen  as needed for pain   Symptoms should improve with medication, if no improvement or any changes return to clinic      ED Prescriptions     Medication Sig Dispense Auth. Provider   doxycycline  (VIBRA -TABS) 100 MG tablet Take 1 tablet (100 mg total) by mouth 2 (two) times daily for 7 days. 14 tablet Ball, Errica Dutil  G, FNP      PDMP not reviewed this encounter.     [1]  Social History Tobacco Use   Smoking status: Some Days    Types: Cigarettes   Smokeless tobacco: Never  Vaping Use   Vaping status: Never Used  Substance Use Topics   Alcohol use: Not Currently   Drug use: Never     Mercer, Raylyn Carton  G, FNP 02/07/24 2004  "
